# Patient Record
Sex: Female | Born: 1975 | Race: Black or African American | Hispanic: No | Marital: Single | Smoking: Never smoker
Health system: Southern US, Community
[De-identification: ages and names within clinical notes are randomized; demographics above are authoritative.]

## PROBLEM LIST (undated history)

## (undated) ENCOUNTER — Inpatient Hospital Stay (HOSPITAL_COMMUNITY): Payer: Self-pay

## (undated) DIAGNOSIS — F32A Depression, unspecified: Secondary | ICD-10-CM

## (undated) DIAGNOSIS — E669 Obesity, unspecified: Secondary | ICD-10-CM

## (undated) DIAGNOSIS — F419 Anxiety disorder, unspecified: Secondary | ICD-10-CM

## (undated) DIAGNOSIS — D649 Anemia, unspecified: Secondary | ICD-10-CM

## (undated) DIAGNOSIS — F329 Major depressive disorder, single episode, unspecified: Secondary | ICD-10-CM

## (undated) HISTORY — DX: Anemia, unspecified: D64.9

---

## 2006-05-26 ENCOUNTER — Emergency Department: Payer: Self-pay | Admitting: Emergency Medicine

## 2007-02-03 ENCOUNTER — Ambulatory Visit: Payer: Self-pay | Admitting: Internal Medicine

## 2007-02-17 ENCOUNTER — Encounter (INDEPENDENT_AMBULATORY_CARE_PROVIDER_SITE_OTHER): Payer: Self-pay | Admitting: Internal Medicine

## 2007-02-17 ENCOUNTER — Ambulatory Visit: Payer: Self-pay | Admitting: Internal Medicine

## 2007-02-23 ENCOUNTER — Encounter (INDEPENDENT_AMBULATORY_CARE_PROVIDER_SITE_OTHER): Payer: Self-pay | Admitting: Internal Medicine

## 2007-02-23 LAB — CONVERTED CEMR LAB

## 2007-09-08 ENCOUNTER — Encounter (INDEPENDENT_AMBULATORY_CARE_PROVIDER_SITE_OTHER): Payer: Self-pay | Admitting: Internal Medicine

## 2007-09-08 DIAGNOSIS — H4089 Other specified glaucoma: Secondary | ICD-10-CM | POA: Insufficient documentation

## 2008-01-27 ENCOUNTER — Telehealth (INDEPENDENT_AMBULATORY_CARE_PROVIDER_SITE_OTHER): Payer: Self-pay | Admitting: Internal Medicine

## 2008-02-03 ENCOUNTER — Encounter (INDEPENDENT_AMBULATORY_CARE_PROVIDER_SITE_OTHER): Payer: Self-pay | Admitting: Internal Medicine

## 2008-02-08 ENCOUNTER — Telehealth (INDEPENDENT_AMBULATORY_CARE_PROVIDER_SITE_OTHER): Payer: Self-pay | Admitting: Internal Medicine

## 2008-02-12 ENCOUNTER — Encounter (INDEPENDENT_AMBULATORY_CARE_PROVIDER_SITE_OTHER): Payer: Self-pay | Admitting: Internal Medicine

## 2008-02-13 ENCOUNTER — Encounter (INDEPENDENT_AMBULATORY_CARE_PROVIDER_SITE_OTHER): Payer: Self-pay | Admitting: Internal Medicine

## 2008-02-27 ENCOUNTER — Encounter (INDEPENDENT_AMBULATORY_CARE_PROVIDER_SITE_OTHER): Payer: Self-pay | Admitting: Internal Medicine

## 2008-06-05 ENCOUNTER — Encounter: Admission: RE | Admit: 2008-06-05 | Discharge: 2008-06-05 | Payer: Self-pay | Admitting: Obstetrics and Gynecology

## 2008-06-05 ENCOUNTER — Encounter (INDEPENDENT_AMBULATORY_CARE_PROVIDER_SITE_OTHER): Payer: Self-pay | Admitting: Internal Medicine

## 2008-08-16 ENCOUNTER — Inpatient Hospital Stay (HOSPITAL_COMMUNITY): Admission: RE | Admit: 2008-08-16 | Discharge: 2008-08-19 | Payer: Self-pay | Admitting: Obstetrics and Gynecology

## 2008-12-19 ENCOUNTER — Emergency Department (HOSPITAL_COMMUNITY): Admission: EM | Admit: 2008-12-19 | Discharge: 2008-12-19 | Payer: Self-pay | Admitting: Emergency Medicine

## 2010-03-04 ENCOUNTER — Emergency Department (HOSPITAL_COMMUNITY): Admission: EM | Admit: 2010-03-04 | Discharge: 2010-03-04 | Payer: Self-pay | Admitting: Emergency Medicine

## 2011-01-11 ENCOUNTER — Encounter: Payer: Self-pay | Admitting: Obstetrics and Gynecology

## 2011-02-21 ENCOUNTER — Emergency Department (HOSPITAL_COMMUNITY)
Admission: EM | Admit: 2011-02-21 | Discharge: 2011-02-21 | Disposition: A | Discharge status: Home / Self Care | Payer: Medicaid Other | Attending: Emergency Medicine | Admitting: Emergency Medicine

## 2011-02-21 DIAGNOSIS — R197 Diarrhea, unspecified: Secondary | ICD-10-CM | POA: Insufficient documentation

## 2011-02-21 DIAGNOSIS — R109 Unspecified abdominal pain: Secondary | ICD-10-CM | POA: Insufficient documentation

## 2011-02-21 DIAGNOSIS — R112 Nausea with vomiting, unspecified: Secondary | ICD-10-CM | POA: Insufficient documentation

## 2011-02-21 LAB — URINALYSIS, ROUTINE W REFLEX MICROSCOPIC
Bilirubin Urine: NEGATIVE
Glucose, UA: NEGATIVE mg/dL
Hgb urine dipstick: NEGATIVE
Ketones, ur: NEGATIVE mg/dL
Nitrite: NEGATIVE
Protein, ur: NEGATIVE mg/dL
Specific Gravity, Urine: 1.033 — ABNORMAL HIGH (ref 1.005–1.030)
Urobilinogen, UA: 0.2 mg/dL (ref 0.0–1.0)
pH: 5.5 (ref 5.0–8.0)

## 2011-02-21 LAB — POCT I-STAT, CHEM 8
BUN: 14 mg/dL (ref 6–23)
Calcium, Ion: 1.09 mmol/L — ABNORMAL LOW (ref 1.12–1.32)
Chloride: 109 mEq/L (ref 96–112)
Creatinine, Ser: 0.7 mg/dL (ref 0.4–1.2)
Glucose, Bld: 110 mg/dL — ABNORMAL HIGH (ref 70–99)
HCT: 40 % (ref 36.0–46.0)
Hemoglobin: 13.6 g/dL (ref 12.0–15.0)
Potassium: 3.8 mEq/L (ref 3.5–5.1)
Sodium: 139 mEq/L (ref 135–145)
TCO2: 20 mmol/L (ref 0–100)

## 2011-02-21 LAB — PREGNANCY, URINE: Preg Test, Ur: NEGATIVE

## 2011-03-16 LAB — CBC
HCT: 34.3 % — ABNORMAL LOW (ref 36.0–46.0)
Hemoglobin: 10.5 g/dL — ABNORMAL LOW (ref 12.0–15.0)
MCHC: 30.7 g/dL (ref 30.0–36.0)
MCV: 86.2 fL (ref 78.0–100.0)
Platelets: 264 10*3/uL (ref 150–400)
RBC: 3.97 MIL/uL (ref 3.87–5.11)
RDW: 15.7 % — ABNORMAL HIGH (ref 11.5–15.5)
WBC: 10.2 10*3/uL (ref 4.0–10.5)

## 2011-03-16 LAB — DIFFERENTIAL
Basophils Absolute: 0 10*3/uL (ref 0.0–0.1)
Basophils Relative: 0 % (ref 0–1)
Eosinophils Absolute: 0 10*3/uL (ref 0.0–0.7)
Eosinophils Relative: 0 % (ref 0–5)
Lymphocytes Relative: 7 % — ABNORMAL LOW (ref 12–46)
Lymphs Abs: 0.7 10*3/uL (ref 0.7–4.0)
Monocytes Absolute: 0.2 10*3/uL (ref 0.1–1.0)
Monocytes Relative: 2 % — ABNORMAL LOW (ref 3–12)
Neutro Abs: 9.3 10*3/uL — ABNORMAL HIGH (ref 1.7–7.7)
Neutrophils Relative %: 91 % — ABNORMAL HIGH (ref 43–77)

## 2011-03-16 LAB — POCT CARDIAC MARKERS
CKMB, poc: 1.1 ng/mL (ref 1.0–8.0)
CKMB, poc: <1 ng/mL — ABNORMAL LOW (ref 1.0–8.0)
Myoglobin, poc: 48.8 ng/mL (ref 12–200)
Myoglobin, poc: 67 ng/mL (ref 12–200)
Troponin i, poc: <0.05 ng/mL (ref 0.00–0.09)
Troponin i, poc: <0.05 ng/mL (ref 0.00–0.09)

## 2011-03-16 LAB — POCT I-STAT, CHEM 8
BUN: 8 mg/dL (ref 6–23)
Calcium, Ion: 1.14 mmol/L (ref 1.12–1.32)
Chloride: 105 mEq/L (ref 96–112)
Creatinine, Ser: 0.7 mg/dL (ref 0.4–1.2)
Glucose, Bld: 110 mg/dL — ABNORMAL HIGH (ref 70–99)
HCT: 35 % — ABNORMAL LOW (ref 36.0–46.0)
Hemoglobin: 11.9 g/dL — ABNORMAL LOW (ref 12.0–15.0)
Potassium: 3.8 mEq/L (ref 3.5–5.1)
Sodium: 138 mEq/L (ref 135–145)
TCO2: 23 mmol/L (ref 0–100)

## 2011-03-16 LAB — URINALYSIS, ROUTINE W REFLEX MICROSCOPIC
Bilirubin Urine: NEGATIVE
Glucose, UA: NEGATIVE mg/dL
Hgb urine dipstick: NEGATIVE
Ketones, ur: NEGATIVE mg/dL
Nitrite: NEGATIVE
Protein, ur: NEGATIVE mg/dL
Specific Gravity, Urine: 1.029 (ref 1.005–1.030)
Urobilinogen, UA: 1 mg/dL (ref 0.0–1.0)
pH: 8 (ref 5.0–8.0)

## 2011-03-16 LAB — POCT PREGNANCY, URINE: Preg Test, Ur: NEGATIVE

## 2011-03-16 LAB — D-DIMER, QUANTITATIVE (NOT AT ARMC): D-Dimer, Quant: 1.87 ug/mL-FEU — ABNORMAL HIGH (ref 0.00–0.48)

## 2011-05-05 NOTE — Discharge Summary (Signed)
[name], Brandy Foster               ACCOUNT NO.:  [id number]   MEDICAL RECORD NO.:  [id number]          PATIENT TYPE:  INP   LOCATION:  9148                          FACILITY:  [facility]   PHYSICIAN:  Crist Fat. Rivard, M.D. DATE OF BIRTH:  [birth year]   DATE OF ADMISSION:  08/16/2008  DATE OF DISCHARGE:  08/19/2008                               DISCHARGE SUMMARY   ADMITTING DIAGNOSES:  1. Intrauterine pregnancy at 38-6/7 weeks.  2. Previous cesarean section with desire for repeat.   DISCHARGE DIAGNOSES:  1. Status post a repeat low transverse cesarean section August 16, 2008 with findings of a viable female infant delivered at 9:47 a.m.      by the name of Brandy Foster.  Weight 6 pounds 12 ounces (3060 grams) 19      inches in length with Apgars of 8 and 9.  2. Bottle feeding primarily.  3. Anemia without hemodynamic instability.  4. History of depression without signs and symptoms postpartum.   PROCEDURES:  1. Repeat low transverse cesarean section.  2. Spinal anesthesia.   HOSPITAL COURSE:  Brandy Foster is a 35 year old gravida 5, para 1-0-3-1 who  presented on the date of admission at 38-6/7 weeks for an elective  repeat cesarean section.  She denied any leakage of fluid or bleeding,  positive fetal movement.  Pregnancy followed by Dr. Pennie Rushing and  remarkable for:  1. Three elective abortions.  2. Previous C section.  3. Fibroids.  4. Elevated BMI.  5. History of anemia.  6. History of depression approximately 10 years ago.   On the day of admission the patient verbalized desire to proceed with  repeat low transverse cesarean section for delivery.  The patient was  prepped in short stay and was taken to the OR, where a repeat low  transverse cesarean section was performed by Dr. Dierdre Forth with  Wynelle Bourgeois, certified nurse midwife, assisting and it was under  spinal anesthesia.  Findings were a viable female infant by the name of  Brandy Foster with a weight of 6 pounds 12  ounces, Apgars 8 and 9.  Estimated  blood loss was 750 mL.  The patient tolerated well, was taken to the  PACU in good condition, and newborn female to full-term nursery.  Postoperative day #1 she was doing well, tolerating p.o.'s.  She was up  on the sofa.  Weight was 126.1 kg.  Had good urinary output.  JP drain 8  mL.  CBC:  White count was 9.6, hemoglobin 8.8, hematocrit 26.9 and  platelets stable at 194.   PHYSICAL EXAM:  Was within normal limits.  Dressing was clean, dry and  intact.  ABDOMEN:  Appropriately tender but soft.  Fundus was firm.  Lochia  within normal limits as well as extremities.   PLAN:  To continue routine postpartum care.  She had a social work  consult secondary to the history of depression on August 18, 2008.  The  father of baby is reported to live in [place].  She is unemployed, though  she does have Medicaid and appropriate  resources including food stamps  and WIC.  She has supportive family and friends.  Family was present and  social work deferred to come back to talk with the patient later on.  By  postoperative #2, August 18, 2008, she was feeling okay.  Pain well  controlled with meds.  She was ambulating and voiding and tolerating  p.o. liquids and solids without difficulty.  Denied any dizziness or  syncope.  She had positive BM, positive flatus.  Bottle-feeding.  Remained afebrile.  Vital signs stable.  She had bowel sounds in all 4  quadrants.  Fundus was firm.  Scant lochia.  The JP drain had 20 mL over  the previous 24 hours.  Negative Homan's sign.  Negative edema.  Had  discussed that day possibly going home after social work consult.  JP  drain was removed without difficulty by Dr. Estanislado Pandy.  At 11:30 a.m. on  August 18, 2008 work returned to reassess the patient's needs.  She  reported good rapport.  She did speak to her and sisters present after  the patient's agreement.  The patient appeared stable.  Social work did  return again on August 19, 2008 at 10:15 a.m. because RNs  had noted the  patient had not been seen holding and bonding with baby.  The patient's  sister had been primarily holding the baby.  The patient had been  holding the baby during assessment this morning.  She does have sister  and mother to assist her at home.  She is a single mom.  Has a 10-year-  old son.  Social work agreed that the patient was appropriate and okay  for discharge.  The patient had reported another bowel movement this  morning.  She was considering breast-feeding when her milk was in.  No  signs or symptoms of postpartum depression.  She has been up ad lib  without dizziness, tolerating a regular diet, spontaneous voids with  some pressure.  Pain managed with Motrin and p.r.n. Percocet.  Undecided  on birth control but considering pills.  She remains afebrile and vital  signs stable.  Weight 124.5 kg.  Physical exam is within normal limits.  The patient was deemed to have received full benefit of her hospital  stay and was discharged home in stable condition.   DISCHARGE MEDICATIONS:  1. Motrin 600 mg p.o. q.6 h p.r.n. pain.  2. Percocet 5/325 one to two tablets p.o. q. 4-6 hours p.r.n. moderate      to severe pain.  3. Concept OB prenatal vitamin 1 tablet p.o. q. Day.  4. Ferralet 90 one tablet p.o. daily p.r.n. anemia.  5. Stool softener of choice per patient over-the-counter 1 tablet p.o.      daily.  May repeat dose in the evening.   DISCHARGE FOLLOWUP:  She has a 6-week postpartum appointment on September 26, 2008 as already scheduled or may follow up p.r.n.   POSTPARTUM INSTRUCTIONS:  Were per [facility] pamphlet.  Warning signs and  symptoms to report were reviewed.      Candice [place], [place]      ______________________________  Crist Fat Rivard, M.D.    CHS/MEDQ  D:  08/19/2008  T:  08/19/2008  Job:  [id number]

## 2011-05-05 NOTE — H&P (Signed)
Brandy, Foster               ACCOUNT NO.:  [id number]   MEDICAL RECORD NO.:  [id number]          PATIENT TYPE:  INP   LOCATION:  9199                          [facility]ACILITY:  [facility]   PHYSICIAN:  Hal Morales, M.D.DATE OF BIRTH:  [birth year]   DATE OF ADMISSION:  08/16/2008  DATE OF DISCHARGE:                              HISTORY & PHYSICAL   HISTORY OF PRESENT ILLNESS:  This is a 35 year old gravida 5, para 1-0-3-  1 at 38-6/7 weeks' who presents for elective repeat C-section.  She  denies any leaking or bleeding, and reports positive fetal movement.  Pregnancy was followed by Dr. Pennie Rushing, remarkable for:  1. Three elective abortions.  2. Previous C-section.  3. Fibroid.  4. Elevated BMI.   ALLERGIES:  None.   OB HISTORY:  Remarkable for AB in 2001, 2002, and 2005.  She had a C-  section delivery in 1998 of a female infant at [redacted] weeks gestation,  weighing 8 pounds, related to failure to progress.   PAST MEDICAL HISTORY:  Remarkable for history of anemia, history of mild  postpartum depression, history of fibroids, childhood varicella, and  elevated BMI.  She also has a history of unknown type of psychiatric  issues, but there is no current medical follow up for a month.   PAST SURGICAL HISTORY:  Remarkable for C-section in 1998 and EAB in  2001, 2002, and 2005.   FAMILY HISTORY:  Remarkable for grandmother with MI.  Mother and father  with hypertension.  Mother with pulmonary disease of unknown type as  well as diabetes and TIAs.  Brother with schizophrenia.   GENETIC HISTORY:  Negative.   SOCIAL HISTORY:  The patient is single.  Father of the baby Arbutus Ped is  not currently with her.  Her sister Tammy with her today.  She does not  report a religious affiliation.  She denies any alcohol, tobacco, or  drug use.   PRENATAL LABS:  Hemoglobin 10.6 and platelets 258,000.  Blood type A  positive, antibody screen negative, RPR nonreactive, rubella immune,  hepatitis negative,  and HIV negative.  Pap test normal.  Gonorrhea  negative and chlamydia negative.  Cystic fibrosis declined.   HISTORY OF CURRENT PREGNANCY:  The patient entered care at [redacted] weeks  gestation.  She had an ultrasound done, which confirmed dates.  She had  a quad screen done that was normal.  Glucola at 27 weeks was normal.  She had some cysts on the back of her neck and was seen for that with no  plan identified in her records currently, but these areas decreased over  time after treatment with Keflex.  She presents today for elective  repeat C-section   OBJECTIVE:  VITAL SIGNS:  Stable and afebrile.  HEENT:  Within normal limits.  NECK:  Thyroid normal not enlarged.  CHEST:  Clear to auscultation.  HEART:  Regular rate and rhythm.  ABDOMEN:  Gravid at 39 cm, vertex Leopold's.  Fetal heart rate has not  been evaluated yet.  PELVIC:  Declined or deferred.  EXTREMITIES:  Within normal limits without edema.  ASSESSMENT:  1. Intrauterine pregnancy at 38-6/7 weeks.  2. Previous cesarean section, desires repeat.   PLAN:  1. Admit to operating suites per Dr. Pennie Rushing.  2. Routine preop orders.      Marie L. Mayford Knife, C.N.M.      ______________________________  Hal Morales, M.D.    MLW/MEDQ  D:  08/16/2008  T:  08/16/2008  Job:  [id number]

## 2011-05-05 NOTE — Op Note (Signed)
Brandy Foster, Brandy Foster               ACCOUNT NO.:  [id number]   MEDICAL RECORD NO.:  [id number]          PATIENT TYPE:  INP   LOCATION:  9148                          [facility]   PHYSICIAN:  Hal Morales, M.D.DATE OF BIRTH:  [birth year]   DATE OF PROCEDURE:  08/16/2008  DATE OF DISCHARGE:                               OPERATIVE REPORT   PREOPERATIVE DIAGNOSES:  1. Intrauterine pregnancy at term.  2. Prior cesarean section with desire for repeat.   POSTOPERATIVE DIAGNOSES:  1. Intrauterine pregnancy at term.  2. Prior cesarean section with desire for repeat.   OPERATION:  Repeat low-transverse cesarean section.   SURGEON:  Hal Morales, MD   FIRST ASSISTANT:  Elby Showers. Mayford Knife, Certified Nurse Midwife   ANESTHESIA:  Spinal.   ESTIMATED BLOOD LOSS:  750 mL.   COMPLICATIONS:  None.   FINDINGS:  The patient was delivered of a female infant whose name is  Melone, weighing 6 pounds 12 ounces with a Apgars of 8 and 9 at 1 and 5  minutes respectively.  The uterus, tubes and ovaries were normal for  gravid state.  The placenta contained an eccentrically inserted three-  vessel cord.   PROCEDURE:  The patient was taken to the operating room after  appropriate identification and placed on the operating table.  After  placement of a spinal anesthetic, she was placed in the supine position  with a left lateral tilt.  The abdomen and perineum were prepped with  multiple layers of Betadine and a Foley catheter inserted into the  bladder then connected to straight drainage.  The abdomen was draped as  a sterile field.  After assurance of adequate surgical anesthesia, the  suprapubic region was infiltrated with 20 mL of 0.25% Marcaine.  A  suprapubic incision was made and the abdomen opened in layers.  The  peritoneum was entered and the bladder blade placed.  The uterus was  incised approximately 2-cm above the uterovesical fold and that incision  taken laterally on either  side.  The membranes were ruptured with the  egress of clear fluid.  The infant was delivered with the aid of a Kiwi  vacuum extractor from the occiput transverse position and after having  the nares and pharynx suctioned and the cord clamped and cut, and was  handed off to the awaiting pediatricians.  The placenta was allowed to  spontaneously separate from the uterus and was removed from the  operative field and given to the employees of [facility]  for cord blood collection.  The endometrial cavity was then cleared  products of conception and the uterine incision closed with running  interlocking suture of 0 Vicryl.  An imbricating suture of 0 Vicryl was  then placed of adequate hemostasis.  Copious irrigation was carried out.  The abdominal peritoneum was closed with a running suture of 2-0 Vicryl.  The rectus muscles were approximated in the midline with figure-of-eight  suture of 2-0 Vicryl.  The rectus fascia was closed with a running  suture of 0 Vicryl and then reinforced on either side  of midline with  figure-of-eight sutures of 0 Vicryl.  The subcutaneous tissue was  irrigated and made hemostatic with Bovie cautery.  A subcutaneous  Jackson-Pratt drain was placed through a stab incision in the left lower  quadrant and sewn in with a suture of 0 silk.  The skin incision was  closed with a subcuticular suture of 3-0 Monocryl.  Sterile Steri-Strips  were applied and a sterile dressing applied.  The patient was taken from  the operating room to the recovery room in satisfactory condition and  having tolerated the procedure well with sponge and instrument counts  correct.  The infant went to the full-term nursery.      Hal Morales, M.D.  Electronically Signed     VPH/MEDQ  D:  08/16/2008  T:  08/17/2008  Job:  [id number]

## 2011-06-15 ENCOUNTER — Ambulatory Visit (INDEPENDENT_AMBULATORY_CARE_PROVIDER_SITE_OTHER): Payer: Medicaid Other | Admitting: Family Medicine

## 2011-06-15 ENCOUNTER — Encounter: Payer: Self-pay | Admitting: Family Medicine

## 2011-06-15 VITALS — BP 138/85 | HR 88 | Temp 99.1°F | Ht 67.13 in | Wt 278.0 lb

## 2011-06-15 DIAGNOSIS — F489 Nonpsychotic mental disorder, unspecified: Secondary | ICD-10-CM

## 2011-06-15 DIAGNOSIS — Z309 Encounter for contraceptive management, unspecified: Secondary | ICD-10-CM

## 2011-06-15 DIAGNOSIS — H4089 Other specified glaucoma: Secondary | ICD-10-CM

## 2011-06-15 DIAGNOSIS — F99 Mental disorder, not otherwise specified: Secondary | ICD-10-CM

## 2011-06-15 DIAGNOSIS — IMO0001 Reserved for inherently not codable concepts without codable children: Secondary | ICD-10-CM

## 2011-06-15 DIAGNOSIS — D509 Iron deficiency anemia, unspecified: Secondary | ICD-10-CM | POA: Insufficient documentation

## 2011-06-15 DIAGNOSIS — D649 Anemia, unspecified: Secondary | ICD-10-CM

## 2011-06-15 DIAGNOSIS — Z0000 Encounter for general adult medical examination without abnormal findings: Secondary | ICD-10-CM

## 2011-06-15 LAB — CBC
HCT: 34.3 % — ABNORMAL LOW (ref 36.0–46.0)
Hemoglobin: 10.3 g/dL — ABNORMAL LOW (ref 12.0–15.0)
MCH: 26.7 pg (ref 26.0–34.0)
MCHC: 30 g/dL (ref 30.0–36.0)
MCV: 88.9 fL (ref 78.0–100.0)
Platelets: 313 10*3/uL (ref 150–400)
RBC: 3.86 MIL/uL — ABNORMAL LOW (ref 3.87–5.11)
RDW: 15.3 % (ref 11.5–15.5)
WBC: 7.7 10*3/uL (ref 4.0–10.5)

## 2011-06-15 MED ORDER — ETONOGESTREL-ETHINYL ESTRADIOL 0.12-0.015 MG/24HR VA RING: 1.0000 | VAGINAL_RING | VAGINAL | Status: DC

## 2011-06-15 NOTE — Patient Instructions (Addendum)
Go to psychiatrist and get medical records Ask central Robbie Lis to send records to our clinic.  Make an appointment for pap smear 1-2 months. Take nuvaring as directed.  Get an appt with your eye doctor for your yearly exam to recheck glaucoma.

## 2011-06-16 ENCOUNTER — Encounter: Payer: Self-pay | Admitting: Family Medicine

## 2011-06-19 DIAGNOSIS — IMO0001 Reserved for inherently not codable concepts without codable children: Secondary | ICD-10-CM | POA: Insufficient documentation

## 2011-06-19 DIAGNOSIS — Z0000 Encounter for general adult medical examination without abnormal findings: Secondary | ICD-10-CM | POA: Insufficient documentation

## 2011-06-19 DIAGNOSIS — F99 Mental disorder, not otherwise specified: Secondary | ICD-10-CM

## 2011-06-19 HISTORY — DX: Mental disorder, not otherwise specified: F99

## 2011-06-19 NOTE — Assessment & Plan Note (Signed)
Pt to return for pap smear.  Pt to request records from [facility].

## 2011-06-19 NOTE — Assessment & Plan Note (Signed)
Pt states that she is on disability 2/2 mental illness but she doesn't know what her diagnosis is.  Pt is to request records from her psychiatrist.

## 2011-06-19 NOTE — Assessment & Plan Note (Signed)
Will recheck cbc to see if anemia persists and if iron supplementation is still needed.

## 2011-06-19 NOTE — Assessment & Plan Note (Signed)
Pt given rx for nuvaring.  Pt interested in implanon.  Will let me know if she decides she would like that instead.

## 2011-06-19 NOTE — Progress Notes (Signed)
  Subjective:    Patient ID: Brandy Foster, female    DOB: [birth year], 35 y.o.   MRN: [id number]  HPI Pt here to establish care.  States she has not had a pcp in years.  Only seen ob for pregnancy care.    Reviewed pmh, sh, fh, medicines and placed under appropriate tabs in chart.  Anemia- Taking iron off and on.  Was started after delivery of 35 year old.  Has not had cbc recheck recently.  Wants to know if she should still be taking iron supplement.  Denies fatigue, dizziness, decreased energy.  Birth control- Currently sexually active with 1 partner.  Usually uses condoms but not 100% of the time.  States she has used nuvaring in the past without problems.  Would like to learn more about other forms of contraception.   Review of Systems    as per above.  No fever. No weight loss. No sob. No cp. No cough. Objective:   Physical Exam  Constitutional: She is oriented to person, place, and time. She appears well-developed and well-nourished.  HENT:  Head: Normocephalic and atraumatic.  Cardiovascular: Normal rate and normal heart sounds.   No murmur heard. Pulmonary/Chest: Effort normal and breath sounds normal. No respiratory distress.  Abdominal: Soft. She exhibits no distension.  Musculoskeletal: Normal range of motion. She exhibits no edema and no tenderness.  Neurological: She is alert and oriented to person, place, and time.  Skin: No rash noted.  Psychiatric: She has a normal mood and affect. Her behavior is normal. Judgment and thought content normal.          Assessment & Plan:

## 2011-06-19 NOTE — Assessment & Plan Note (Signed)
Pt is due for yearly exam.  Not currently on eye medication.  She was told at last optho appt she no longer needs them.  Encouraged pt to schedule yearly eye exam.

## 2011-11-20 ENCOUNTER — Encounter: Payer: Medicaid Other | Admitting: Family Medicine

## 2011-12-07 ENCOUNTER — Encounter: Payer: Medicaid Other | Admitting: Family Medicine

## 2012-04-11 ENCOUNTER — Encounter: Payer: Medicaid Other | Admitting: Family Medicine

## 2012-05-13 ENCOUNTER — Telehealth (HOSPITAL_COMMUNITY): Payer: Self-pay | Admitting: Psychiatry

## 2012-05-13 ENCOUNTER — Encounter (HOSPITAL_COMMUNITY): Payer: Self-pay | Admitting: Psychiatry

## 2012-05-13 NOTE — Telephone Encounter (Signed)
CALLED PATIENT REGARDING FMLA.

## 2012-05-13 NOTE — Telephone Encounter (Signed)
Erroneous ENCOUNTER, ALL TELEPHONE NOTES FROM THIS PROVIDER IS IN ERROR.

## 2012-11-17 DIAGNOSIS — R110 Nausea: Secondary | ICD-10-CM | POA: Insufficient documentation

## 2012-11-17 DIAGNOSIS — Z862 Personal history of diseases of the blood and blood-forming organs and certain disorders involving the immune mechanism: Secondary | ICD-10-CM | POA: Insufficient documentation

## 2012-11-17 DIAGNOSIS — H53149 Visual discomfort, unspecified: Secondary | ICD-10-CM | POA: Insufficient documentation

## 2012-11-17 DIAGNOSIS — G43909 Migraine, unspecified, not intractable, without status migrainosus: Secondary | ICD-10-CM | POA: Insufficient documentation

## 2012-11-17 DIAGNOSIS — Z3202 Encounter for pregnancy test, result negative: Secondary | ICD-10-CM | POA: Insufficient documentation

## 2012-11-17 DIAGNOSIS — Z202 Contact with and (suspected) exposure to infections with a predominantly sexual mode of transmission: Secondary | ICD-10-CM | POA: Insufficient documentation

## 2012-11-18 ENCOUNTER — Emergency Department (HOSPITAL_COMMUNITY)
Admission: EM | Admit: 2012-11-18 | Discharge: 2012-11-18 | Disposition: A | Discharge status: Home / Self Care | Payer: Medicaid Other | Attending: Emergency Medicine | Admitting: Emergency Medicine

## 2012-11-18 ENCOUNTER — Encounter (HOSPITAL_COMMUNITY): Payer: Self-pay | Admitting: *Deleted

## 2012-11-18 DIAGNOSIS — Z202 Contact with and (suspected) exposure to infections with a predominantly sexual mode of transmission: Secondary | ICD-10-CM

## 2012-11-18 LAB — URINALYSIS, ROUTINE W REFLEX MICROSCOPIC
Bilirubin Urine: NEGATIVE
Glucose, UA: NEGATIVE mg/dL
Ketones, ur: NEGATIVE mg/dL
Leukocytes, UA: NEGATIVE
Nitrite: NEGATIVE
Protein, ur: NEGATIVE mg/dL
Specific Gravity, Urine: 1.02 (ref 1.005–1.030)
Urobilinogen, UA: 1 mg/dL (ref 0.0–1.0)
pH: 7 (ref 5.0–8.0)

## 2012-11-18 LAB — RAPID HIV SCREEN (WH-MAU): Rapid HIV Screen: NONREACTIVE

## 2012-11-18 LAB — WET PREP, GENITAL
Trich, Wet Prep: NONE SEEN
Yeast Wet Prep HPF POC: NONE SEEN

## 2012-11-18 LAB — URINE MICROSCOPIC-ADD ON

## 2012-11-18 LAB — PREGNANCY, URINE: Preg Test, Ur: NEGATIVE

## 2012-11-18 MED ORDER — AZITHROMYCIN 250 MG PO TABS
1000.0000 mg | ORAL_TABLET | Freq: Once | ORAL | Status: AC
  Administered 2012-11-18: 1000 mg via ORAL
  Filled 2012-11-18: qty 4

## 2012-11-18 MED ORDER — CEFTRIAXONE SODIUM 250 MG IJ SOLR
250.0000 mg | Freq: Once | INTRAMUSCULAR | Status: DC
  Filled 2012-11-18: qty 250

## 2012-11-18 MED ORDER — DOXYCYCLINE HYCLATE 100 MG PO CAPS: 100.0000 mg | ORAL_CAPSULE | Freq: Two times a day (BID) | ORAL | Status: DC

## 2012-11-18 MED ORDER — LIDOCAINE HCL (PF) 1 % IJ SOLN
INTRAMUSCULAR | Status: AC
  Administered 2012-11-18: 1 mL
  Filled 2012-11-18: qty 5
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
## 2012-11-18 NOTE — ED Provider Notes (Signed)
Medical screening examination/treatment/procedure(s) were performed by non-physician practitioner and as supervising physician I was immediately available for consultation/collaboration.   Zarek Relph M Siyah Mault, MD 11/18/12 0913 

## 2012-11-18 NOTE — ED Notes (Signed)
Pt refused rocephin injection.
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
## 2012-11-20 LAB — GC/CHLAMYDIA PROBE AMP
CT Probe RNA: NEGATIVE
GC Probe RNA: NEGATIVE

## 2013-06-11 ENCOUNTER — Encounter (HOSPITAL_COMMUNITY): Payer: Self-pay | Admitting: Adult Health

## 2013-06-11 ENCOUNTER — Emergency Department (HOSPITAL_COMMUNITY)
Admission: EM | Admit: 2013-06-11 | Discharge: 2013-06-11 | Disposition: A | Discharge status: Home / Self Care | Payer: Medicaid Other | Attending: Emergency Medicine | Admitting: Emergency Medicine

## 2013-06-11 DIAGNOSIS — H6690 Otitis media, unspecified, unspecified ear: Secondary | ICD-10-CM | POA: Insufficient documentation

## 2013-06-11 DIAGNOSIS — J029 Acute pharyngitis, unspecified: Secondary | ICD-10-CM | POA: Insufficient documentation

## 2013-06-11 DIAGNOSIS — H9209 Otalgia, unspecified ear: Secondary | ICD-10-CM | POA: Insufficient documentation

## 2013-06-11 DIAGNOSIS — Z862 Personal history of diseases of the blood and blood-forming organs and certain disorders involving the immune mechanism: Secondary | ICD-10-CM | POA: Insufficient documentation

## 2013-06-11 DIAGNOSIS — H6691 Otitis media, unspecified, right ear: Secondary | ICD-10-CM

## 2013-06-11 DIAGNOSIS — J3489 Other specified disorders of nose and nasal sinuses: Secondary | ICD-10-CM | POA: Insufficient documentation

## 2013-06-11 MED ORDER — AMOXICILLIN 500 MG PO CAPS: 500.0000 mg | ORAL_CAPSULE | Freq: Three times a day (TID) | ORAL | Status: DC

## 2013-06-11 MED ORDER — ANTIPYRINE-BENZOCAINE 5.4-1.4 % OT SOLN: 3.0000 [drp] | OTIC | Status: DC | PRN

## 2013-06-11 NOTE — ED Provider Notes (Signed)
History  This chart was scribed for Brandy Jakes, MD by Ardelia Mems, ED Scribe. This patient was seen in room TR08C/TR08C and the patient's care was started at 8:49 PM.   CSN: [id number]  Arrival date & time 06/11/13  1932       Chief Complaint  Patient presents with  . Facial Pain     The history is provided by the patient. No language interpreter was used.    HPI Comments: Brandy Foster is a 37 y.o. female with a hx of anemia who presents to the Emergency Department complaining of a gradually worsening, constant sore throat onset 2 days ago. Pt states that the sore throat has persisted and she had an onset of other, associated symptoms yesterday, including nasal congestion, sinus pressure and bilateral ear pain (right greater than left). Pt states ear pain increasing in severity, feeling "clogged." Pt has taken no interventions. Nothing makes it better or worse. Pt denies sick contacts. Pt denies fever, vomiting, diarrhea or any other symptoms.   PCP- Dr. Cindra Presume    Past Medical History  Diagnosis Date  . Anemia     Past Surgical History  Procedure Laterality Date  . Cesarean section      Family History  Problem Relation Age of Onset  . Diabetes Mother   . COPD Mother   . Arthritis Mother   . Hypertension Mother   . Hyperlipidemia Father   . Schizophrenia Brother     History  Substance Use Topics  . Smoking status: Never Smoker   . Smokeless tobacco: Never Used  . Alcohol Use: No    OB History   Grav Para Term Preterm Abortions TAB SAB Ect Mult Living                  Review of Systems  Constitutional: Negative for fever.  HENT: Positive for ear pain, congestion, sore throat and sinus pressure.   Respiratory: Negative for cough and shortness of breath.   Cardiovascular: Negative for chest pain.  Gastrointestinal: Negative for nausea, vomiting, abdominal pain and diarrhea.  Genitourinary: Negative for dysuria and hematuria.  Skin:  Negative for rash.   A complete 10 system review of systems was obtained and all systems are negative except as noted in the HPI and PMH.   Allergies  Review of patient's allergies indicates no known allergies.  Home Medications  No current outpatient prescriptions on file.  Triage Vitals: BP 109/65  Pulse 91  Temp(Src) 99.2 F (37.3 C) (Oral)  Resp 16  SpO2 99%  Physical Exam  Nursing note and vitals reviewed. Constitutional: She is oriented to person, place, and time. She appears well-developed and well-nourished. No distress.  HENT:  Head: Normocephalic and atraumatic. No trismus in the jaw.  Right Ear: Hearing and tympanic membrane normal.  Left Ear: Hearing normal. There is tenderness. No drainage. Tympanic membrane is bulging.  Nose: Nose normal. Right sinus exhibits no maxillary sinus tenderness and no frontal sinus tenderness. Left sinus exhibits no maxillary sinus tenderness and no frontal sinus tenderness.  Mouth/Throat: Posterior oropharyngeal erythema present. No oropharyngeal exudate, posterior oropharyngeal edema or tonsillar abscesses.  Left TM erythematous, bulging, tender, dull light reflex  Eyes: Conjunctivae and EOM are normal.  Neck: Normal range of motion. Neck supple.  No meningeal signs  Cardiovascular: Normal rate, regular rhythm and normal heart sounds.  Exam reveals no gallop and no friction rub.   No murmur heard. Pulmonary/Chest: Effort normal and breath sounds normal. No  respiratory distress. She has no wheezes. She has no rales. She exhibits no tenderness.  Abdominal: Soft. Bowel sounds are normal. She exhibits no distension. There is no tenderness. There is no rebound and no guarding.  Musculoskeletal: Normal range of motion. She exhibits no edema and no tenderness.  Neurological: She is alert and oriented to person, place, and time. No cranial nerve deficit.  Skin: Skin is warm and dry. She is not diaphoretic. No erythema.    ED Course   Procedures (including critical care time)  DIAGNOSTIC STUDIES: Oxygen Saturation is 99% on RA, normal by my interpretation.    COORDINATION OF CARE: 9:05 PM- Pt advised of plan for treatment with antibiotics and ear drops upon discharge and pt agrees.      Labs Reviewed - No data to display No results found. Discharge Medication List as of 06/11/2013  9:20 PM    START taking these medications   Details  amoxicillin (AMOXIL) 500 MG capsule Take 1 capsule (500 mg total) by mouth 3 (three) times daily., Starting 06/11/2013, Until Discontinued, Print    antipyrine-benzocaine (AURALGAN) otic solution Place 3 drops into the left ear every 2 (two) hours as needed for pain., Starting 06/11/2013, Until Discontinued, Print         1. Right otitis media       MDM  Patient presents with otalgia and exam consistent with acute otitis media. No concern for acute mastoiditis, meningitis. No antibiotic use in the last month.  Patient discharged home with Amoxicillin.   I have also discussed reasons to return immediately to the ER.  Pt expresses understanding and agrees with plan. I personally performed the services described in this documentation, which was scribed in my presence. The recorded information has been reviewed and is accurate.    Glade Nurse, PA-C 06/11/13 2354

## 2013-06-11 NOTE — ED Notes (Signed)
Presents with 2 days of stuffy nose, congestion, sinus pressure and bilateral earaches.

## 2013-06-13 NOTE — ED Provider Notes (Signed)
Medical screening examination/treatment/procedure(s) were performed by non-physician practitioner and as supervising physician I was immediately available for consultation/collaboration.   [name], MD 06/13/13 [phone]

## 2013-11-04 ENCOUNTER — Inpatient Hospital Stay (HOSPITAL_COMMUNITY)
Admission: AD | Admit: 2013-11-04 | Discharge: 2013-11-04 | Disposition: A | Discharge status: Home / Self Care | Payer: Medicaid Other | Source: Ambulatory Visit | Attending: Obstetrics and Gynecology | Admitting: Obstetrics and Gynecology

## 2013-11-04 ENCOUNTER — Inpatient Hospital Stay (HOSPITAL_COMMUNITY): Payer: Medicaid Other

## 2013-11-04 ENCOUNTER — Encounter (HOSPITAL_COMMUNITY): Payer: Self-pay

## 2013-11-04 DIAGNOSIS — R109 Unspecified abdominal pain: Secondary | ICD-10-CM | POA: Insufficient documentation

## 2013-11-04 DIAGNOSIS — O2390 Unspecified genitourinary tract infection in pregnancy, unspecified trimester: Secondary | ICD-10-CM | POA: Insufficient documentation

## 2013-11-04 DIAGNOSIS — O9989 Other specified diseases and conditions complicating pregnancy, childbirth and the puerperium: Secondary | ICD-10-CM

## 2013-11-04 DIAGNOSIS — N760 Acute vaginitis: Secondary | ICD-10-CM | POA: Insufficient documentation

## 2013-11-04 DIAGNOSIS — O26899 Other specified pregnancy related conditions, unspecified trimester: Secondary | ICD-10-CM

## 2013-11-04 DIAGNOSIS — A499 Bacterial infection, unspecified: Secondary | ICD-10-CM | POA: Insufficient documentation

## 2013-11-04 DIAGNOSIS — B9689 Other specified bacterial agents as the cause of diseases classified elsewhere: Secondary | ICD-10-CM

## 2013-11-04 HISTORY — DX: Depression, unspecified: F32.A

## 2013-11-04 HISTORY — DX: Anxiety disorder, unspecified: F41.9

## 2013-11-04 HISTORY — DX: Major depressive disorder, single episode, unspecified: F32.9

## 2013-11-04 LAB — URINE MICROSCOPIC-ADD ON

## 2013-11-04 LAB — POCT PREGNANCY, URINE: Preg Test, Ur: POSITIVE — AB

## 2013-11-04 LAB — URINALYSIS, ROUTINE W REFLEX MICROSCOPIC
Bilirubin Urine: NEGATIVE
Glucose, UA: NEGATIVE mg/dL
Ketones, ur: NEGATIVE mg/dL
Leukocytes, UA: NEGATIVE
Nitrite: NEGATIVE
Protein, ur: NEGATIVE mg/dL
Specific Gravity, Urine: >1.03 — ABNORMAL HIGH (ref 1.005–1.030)
Urobilinogen, UA: 0.2 mg/dL (ref 0.0–1.0)
pH: 6 (ref 5.0–8.0)

## 2013-11-04 LAB — CBC
HCT: 32.8 % — ABNORMAL LOW (ref 36.0–46.0)
Hemoglobin: 10.1 g/dL — ABNORMAL LOW (ref 12.0–15.0)
MCH: 26.7 pg (ref 26.0–34.0)
MCHC: 30.8 g/dL (ref 30.0–36.0)
MCV: 86.8 fL (ref 78.0–100.0)
Platelets: 297 10*3/uL (ref 150–400)
RBC: 3.78 MIL/uL — ABNORMAL LOW (ref 3.87–5.11)
RDW: 16 % — ABNORMAL HIGH (ref 11.5–15.5)
WBC: 10 10*3/uL (ref 4.0–10.5)

## 2013-11-04 LAB — WET PREP, GENITAL
Clue Cells Wet Prep HPF POC: NONE SEEN
Trich, Wet Prep: NONE SEEN
Yeast Wet Prep HPF POC: NONE SEEN

## 2013-11-04 LAB — HCG, QUANTITATIVE, PREGNANCY: hCG, Beta Chain, Quant, S: 1722 m[IU]/mL — ABNORMAL HIGH (ref <5)

## 2013-11-04 MED ORDER — METRONIDAZOLE 500 MG PO TABS: 500.0000 mg | ORAL_TABLET | Freq: Two times a day (BID) | ORAL | Status: DC

## 2013-11-04 NOTE — MAU Provider Note (Signed)
History     CSN: [id number]  Arrival date and time: 11/04/13 1652   None     Chief Complaint  Patient presents with  . Vaginal Discharge   HPI 37 y.o. [id number] at [redacted]w[redacted]d with mid-low abd cramping x several days, no relief with tylenol. One episode of brownish vaginal discharge a few days ago, none since, no bleeding. Per review of prior records, blood type is A pos.   Past Medical History  Diagnosis Date  . Anemia   . Depression   . Anxiety     Past Surgical History  Procedure Laterality Date  . Cesarean section      Family History  Problem Relation Age of Onset  . Diabetes Mother   . COPD Mother   . Arthritis Mother   . Hypertension Mother   . Hyperlipidemia Father   . Schizophrenia Brother     History  Substance Use Topics  . Smoking status: Never Smoker   . Smokeless tobacco: Never Used  . Alcohol Use: No    Allergies: No Known Allergies  Prescriptions prior to admission  Medication Sig Dispense Refill  . ferrous sulfate 325 (65 FE) MG tablet Take 325 mg by mouth daily.      Marland Kitchen ofloxacin (OCUFLOX) 0.3 % ophthalmic solution Place 1 drop into both eyes 3 (three) times daily. Pt started ~ 10/21/13        Review of Systems  Constitutional: Negative.   Respiratory: Negative.   Cardiovascular: Negative.   Gastrointestinal: Positive for abdominal pain (cramping). Negative for nausea, vomiting, diarrhea and constipation.  Genitourinary: Negative for dysuria, urgency, frequency, hematuria and flank pain.       Negative for vaginal bleeding, vaginal discharge, dyspareunia  Musculoskeletal: Negative.   Neurological: Negative.   Psychiatric/Behavioral: Negative.    Physical Exam   Blood pressure 108/63, pulse 95, temperature 98.6 F (37 C), temperature source Oral, resp. rate 18, height 5\' 7"  (1.702 m), weight 290 lb 4 oz (131.657 kg), last menstrual period 09/13/2013.  Physical Exam  Nursing note and vitals reviewed. Constitutional: She is oriented to  person, place, and time. She appears well-developed and well-nourished. No distress.  Cardiovascular: Normal rate.   Respiratory: Effort normal.  GI: Soft. There is no tenderness.  Genitourinary: Vaginal discharge (malodorous, brownish) found.  Swab only, patient declines full pelvic exam   Musculoskeletal: Normal range of motion.  Neurological: She is alert and oriented to person, place, and time.  Skin: Skin is warm and dry.  Psychiatric: She has a normal mood and affect.    MAU Course  Procedures  Results for orders placed during the hospital encounter of 11/04/13 (from the past 24 hour(s))  URINALYSIS, ROUTINE W REFLEX MICROSCOPIC     Status: Abnormal   Collection Time    11/04/13  5:05 PM      Result Value Range   Color, Urine YELLOW  YELLOW   APPearance CLEAR  CLEAR   Specific Gravity, Urine >1.030 (*) 1.005 - 1.030   pH 6.0  5.0 - 8.0   Glucose, UA NEGATIVE  NEGATIVE mg/dL   Hgb urine dipstick TRACE (*) NEGATIVE   Bilirubin Urine NEGATIVE  NEGATIVE   Ketones, ur NEGATIVE  NEGATIVE mg/dL   Protein, ur NEGATIVE  NEGATIVE mg/dL   Urobilinogen, UA 0.2  0.0 - 1.0 mg/dL   Nitrite NEGATIVE  NEGATIVE   Leukocytes, UA NEGATIVE  NEGATIVE  URINE MICROSCOPIC-ADD ON     Status: Abnormal   Collection Time  11/04/13  5:05 PM      Result Value Range   Squamous Epithelial / LPF FEW (*) RARE   WBC, UA 0-2  <3 WBC/hpf   RBC / HPF 0-2  <3 RBC/hpf   Bacteria, UA MANY (*) RARE   Urine-Other MUCOUS PRESENT    POCT PREGNANCY, URINE     Status: Abnormal   Collection Time    11/04/13  5:24 PM      Result Value Range   Preg Test, Ur POSITIVE (*) NEGATIVE  CBC     Status: Abnormal   Collection Time    11/04/13  6:35 PM      Result Value Range   WBC 10.0  4.0 - 10.5 K/uL   RBC 3.78 (*) 3.87 - 5.11 MIL/uL   Hemoglobin 10.1 (*) 12.0 - 15.0 g/dL   HCT 16.1 (*) 09.6 - 04.5 %   MCV 86.8  78.0 - 100.0 fL   MCH 26.7  26.0 - 34.0 pg   MCHC 30.8  30.0 - 36.0 g/dL   RDW 40.9 (*) 81.1 -  15.5 %   Platelets 297  150 - 400 K/uL  HCG, QUANTITATIVE, PREGNANCY     Status: Abnormal   Collection Time    11/04/13  6:35 PM      Result Value Range   hCG, Beta Chain, Quant, S 1722 (*) <5 mIU/mL   US Ob Comp Less 14 Wks  11/04/2013   CLINICAL DATA:  Rule out ectopic. Vaginal bleeding. Beta HCG is 1,722. EDC by LMP is 06/20/2014. Gestational age by LMP is 7 weeks 3 days. Uncertain LMP of 09/13/2013.  EXAM: OBSTETRIC <14 WK [place] AND TRANSVAGINAL OB US  TECHNIQUE: Both transabdominal and transvaginal ultrasound examinations were performed for complete evaluation of the gestation as well as the maternal uterus, adnexal regions, and pelvic cul-de-sac. Transvaginal technique was performed to assess early pregnancy.  COMPARISON:  None.  FINDINGS: Intrauterine gestational sac: Present  Yolk sac:  Not seen  Embryo:  Not seen  Cardiac Activity: Not seen  MSD:  7.3  mm   5 w   2  d  Maternal uterus/adnexae: No subchorionic hemorrhage identified. The right ovary has a normal appearance. Probable left corpus luteum cyst is present.  IMPRESSION: 1. Probable early intrauterine gestational sac, but no yolk sac, fetal pole, or cardiac activity yet visualized. Recommend follow-up quantitative B-HCG levels and follow-up US in 14 days to confirm and assess viability. This recommendation follows SRU consensus guidelines: Diagnostic Criteria for Nonviable Pregnancy Early in the First Trimester. Malva Limes Med 2013; 914:7829-56. 2. Normal appearance of the ovaries.   Electronically Signed   By: Rosalie Gums M.D.   On: 11/04/2013 19:18    Assessment and Plan   1. Abdominal pain in pregnancy, antepartum   2. BV (bacterial vaginosis)   Discharged patient prior to wet prep results coming back d/t needing to leave to pick up child. Rx sent for flagyl based on appearance and odor of discharge. Will call with results when wet prep complete.  Pt will return to MAU on Tuesday for repeat quant, has appointment at [facility] for NOB on  Tuesday morning, may choose to do follow up there if that is what her provider recommends.     Medication List         ferrous sulfate 325 (65 FE) MG tablet  Take 325 mg by mouth daily.     metroNIDAZOLE 500 MG tablet  Commonly known as:  FLAGYL  Take 1 tablet (500 mg total) by mouth 2 (two) times daily.     ofloxacin 0.3 % ophthalmic solution  Commonly known as:  OCUFLOX  Place 1 drop into both eyes 3 (three) times daily. Pt started ~ 10/21/13            Follow-up Information   Follow up with [facility] [place] [facility] On 11/07/2013.   Contact information:   [id number] [place] [address] [phone]        [facility] 11/04/2013, 5:58 PM

## 2013-11-04 NOTE — MAU Note (Signed)
Pt states two nights ago had brown tinged vaginal discharge, was concerned she may be miscarrying. No bleeding or abnormal vaginal d/c at present per pt. Has been having intermittent abd cramping. Is unsure if this is normal. Pain is in middle of lower abdomen. Feels like an aching pain. Pain wraps around to lower back.

## 2013-11-04 NOTE — MAU Note (Signed)
Pain for 4-5 days. Has taken tylenol with no help.

## 2013-11-06 LAB — GC/CHLAMYDIA PROBE AMP
CT Probe RNA: NEGATIVE
GC Probe RNA: NEGATIVE

## 2013-11-07 ENCOUNTER — Ambulatory Visit: Payer: Medicaid Other | Admitting: Family Medicine

## 2013-11-07 NOTE — MAU Provider Note (Signed)
Attestation of Attending Supervision of Advanced Practitioner (CNM/NP): Evaluation and management procedures were performed by the Advanced Practitioner under my supervision and collaboration.  I have reviewed the Advanced Practitioner's note and chart, and I agree with the management and plan.  Velma Hanna 11/07/2013 9:34 AM   

## 2013-11-10 ENCOUNTER — Ambulatory Visit (INDEPENDENT_AMBULATORY_CARE_PROVIDER_SITE_OTHER): Payer: Medicaid Other | Admitting: Family Medicine

## 2013-11-10 ENCOUNTER — Encounter: Payer: Self-pay | Admitting: Family Medicine

## 2013-11-10 VITALS — BP 116/65 | HR 110 | Temp 99.0°F | Ht 67.0 in | Wt 297.0 lb

## 2013-11-10 DIAGNOSIS — Z3480 Encounter for supervision of other normal pregnancy, unspecified trimester: Secondary | ICD-10-CM

## 2013-11-10 DIAGNOSIS — Z3481 Encounter for supervision of other normal pregnancy, first trimester: Secondary | ICD-10-CM

## 2013-11-10 DIAGNOSIS — N912 Amenorrhea, unspecified: Secondary | ICD-10-CM

## 2013-11-10 LAB — POCT URINE PREGNANCY: Preg Test, Ur: POSITIVE

## 2013-11-10 NOTE — Assessment & Plan Note (Addendum)
[facility] - requesting referral to [facility] [place] [facility], advised that she could be followed here but she would rather return where she was followed for her last pregnancy - referral submitted - gave pregnancy verification form

## 2013-11-10 NOTE — Progress Notes (Signed)
  Subjective:    Patient ID: Brandy Foster, female    DOB: October 17, 1976, 37 y.o.   MRN: [id number]  HPI Pt presents for confirmation of pregnancy and referral to obgyn. She is G5P2 with an EDD of 06/20/14. She is not having any pain, bleeding, LOF, contractions.   Review of Systems  Eyes: Negative for visual disturbance.  Respiratory: Negative for shortness of breath.   Cardiovascular: Positive for leg swelling. Negative for chest pain.  All other systems reviewed and are negative.       Objective:   Physical Exam  Nursing note and vitals reviewed. Constitutional: She is oriented to person, place, and time. She appears well-developed and well-nourished. No distress.  HENT:  Head: Normocephalic and atraumatic.  Right Ear: External ear normal.  Left Ear: External ear normal.  Nose: Nose normal.  Eyes: Conjunctivae are normal. Right eye exhibits no discharge. Left eye exhibits no discharge. No scleral icterus.  Pulmonary/Chest: Effort normal.  Neurological: She is alert and oriented to person, place, and time.  Skin: Skin is warm and dry. No rash noted. She is not diaphoretic.  Psychiatric: She has a normal mood and affect. Her behavior is normal.          Assessment & Plan:

## 2013-11-10 NOTE — Patient Instructions (Signed)
Pregnancy, First Trimester °The first trimester is the first 3 months your baby is growing inside you. It is important to follow your doctor's instructions. °HOME CARE  °· Do not smoke. °· Do not drink alcohol. °· Only take medicine as told by your doctor. °· Exercise. °· Eat healthy foods. Eat regular, well-balanced meals. °· You can have sex (intercourse) if there are no other problems with the pregnancy. °· Things that help with morning sickness: °· Eat soda crackers before getting up in the morning. °· Eat 4 to 5 small meals rather than 3 large meals. °· Drink liquids between meals, not during meals. °· Go to all appointments as told. °· Take all vitamins or supplements as told by your doctor. °GET HELP RIGHT AWAY IF:  °· You develop a fever. °· You have a bad smelling fluid that is leaking from your vagina. °· There is bleeding from the vagina. °· You develop severe belly (abdominal) or back pain. °· You throw up (vomit) blood. It may look like coffee grounds. °· You lose more than 2 pounds in a week. °· You gain 5 pounds or more in a week. °· You gain more than 2 pounds in a week and you see puffiness (swelling) in your feet, ankles, or legs. °· You have severe dizziness or pass out (faint). °· You are around people who have German measles, chickenpox, or fifth disease. °· You have a headache, watery poop (diarrhea), pain with peeing (urinating), or cannot breath right. °Document Released: 05/25/2008 Document Revised: 02/29/2012 Document Reviewed: 05/25/2008 °ExitCare® Patient Information ©2014 [employer]. ° °

## 2013-11-21 ENCOUNTER — Telehealth: Payer: Self-pay | Admitting: Family Medicine

## 2013-11-21 NOTE — Telephone Encounter (Signed)
Pt called because her referral for obgyn was put on epic, but the place where she is going is not on epic and we need to send a fax or do a verbal to them jw

## 2013-11-22 ENCOUNTER — Inpatient Hospital Stay (HOSPITAL_COMMUNITY): Payer: Medicaid Other

## 2013-11-22 ENCOUNTER — Encounter (HOSPITAL_COMMUNITY): Payer: Self-pay | Admitting: *Deleted

## 2013-11-22 ENCOUNTER — Inpatient Hospital Stay (HOSPITAL_COMMUNITY)
Admission: AD | Admit: 2013-11-22 | Discharge: 2013-11-22 | Disposition: A | Discharge status: Home / Self Care | Payer: Medicaid Other | Source: Ambulatory Visit | Attending: Obstetrics & Gynecology | Admitting: Obstetrics & Gynecology

## 2013-11-22 DIAGNOSIS — O021 Missed abortion: Secondary | ICD-10-CM | POA: Insufficient documentation

## 2013-11-22 DIAGNOSIS — O039 Complete or unspecified spontaneous abortion without complication: Secondary | ICD-10-CM

## 2013-11-22 DIAGNOSIS — R109 Unspecified abdominal pain: Secondary | ICD-10-CM | POA: Insufficient documentation

## 2013-11-22 LAB — HCG, QUANTITATIVE, PREGNANCY: hCG, Beta Chain, Quant, S: 2962 m[IU]/mL — ABNORMAL HIGH (ref <5)

## 2013-11-22 MED ORDER — PROMETHAZINE HCL 25 MG PO TABS: 25.0000 mg | ORAL_TABLET | Freq: Four times a day (QID) | ORAL | Status: DC | PRN

## 2013-11-22 MED ORDER — ACETAMINOPHEN 325 MG PO TABS: 650.0000 mg | ORAL_TABLET | Freq: Once | ORAL | Status: DC

## 2013-11-22 MED ORDER — OXYCODONE-ACETAMINOPHEN 5-325 MG PO TABS: 2.0000 | ORAL_TABLET | ORAL | Status: DC | PRN

## 2013-11-22 NOTE — MAU Note (Signed)
Patient is in with c/o of new onset bleeding (started brown color on Monday, now its red) and lower abdominal cramping.

## 2013-11-22 NOTE — MAU Provider Note (Signed)
History     CSN: [id number]  Arrival date and time: 11/22/13 1631   First Provider Initiated Contact with Patient 11/22/13 1729      Chief Complaint  Patient presents with  . Vaginal Bleeding  . Abdominal Cramping   HPI Comments: Brandy Foster 37 y.o. [id number] presents to MAU with vaginal bleeding in pregnancy at 10 weeks. She had a similar episode on 11/04/13 and did not return for repeat BHCG or ultrasound.  She had stopped bleeding until 3 days ago and she started again. She changes her pad 1-2 times per day. She plans her OB care at [facility]     Vaginal Bleeding Associated symptoms include abdominal pain.  Abdominal Cramping      Past Medical History  Diagnosis Date  . Anemia   . Depression   . Anxiety     Past Surgical History  Procedure Laterality Date  . Cesarean section      Family History  Problem Relation Age of Onset  . Diabetes Mother   . COPD Mother   . Arthritis Mother   . Hypertension Mother   . Hyperlipidemia Father   . Schizophrenia Brother     History  Substance Use Topics  . Smoking status: Never Smoker   . Smokeless tobacco: Never Used  . Alcohol Use: No    Allergies: No Known Allergies  Prescriptions prior to admission  Medication Sig Dispense Refill  . ferrous sulfate 325 (65 FE) MG tablet Take 325 mg by mouth daily.        Review of Systems  Constitutional: Negative.   HENT: Negative.   Eyes: Negative.   Respiratory: Negative.   Cardiovascular: Negative.   Gastrointestinal: Positive for abdominal pain.  Genitourinary: Negative.        Vaginal bleeding  Musculoskeletal: Negative.   Skin: Negative.   Neurological: Negative.   Psychiatric/Behavioral: The patient is nervous/anxious.    Physical Exam   Blood pressure 115/64, pulse 87, temperature 98.1 F (36.7 C), temperature source Oral, resp. rate 18, last menstrual period 09/13/2013.  Physical Exam  Constitutional: She is oriented to person, place, and time. She  appears well-developed and well-nourished. No distress.  Eyes: Pupils are equal, round, and reactive to light.  Cardiovascular: Normal rate, regular rhythm and normal heart sounds.   Respiratory: Effort normal and breath sounds normal.  GI: Soft. Bowel sounds are normal.  Genitourinary:  Pt has extreme anxiety about speculum exam. She could not relax enough to get more than a quick look. She was shaking and crying. There was blood in vault, otherwise unsure of findings  Musculoskeletal: Normal range of motion.  Neurological: She is alert and oriented to person, place, and time.  Skin: Skin is warm and dry.  Psychiatric: Her mood appears anxious.  tearful   Results for orders placed during the hospital encounter of 11/22/13 (from the past 24 hour(s))  HCG, QUANTITATIVE, PREGNANCY     Status: Abnormal   Collection Time    11/22/13  6:58 PM      Result Value Range   hCG, Beta Chain, Quant, S 2962 (*) <5 mIU/mL   US Ob Transvaginal  11/22/2013   CLINICAL DATA:  Bleeding.  EXAM: TRANSVAGINAL OB ULTRASOUND  TECHNIQUE: Transvaginal ultrasound was performed for complete evaluation of the gestation as well as the maternal uterus, adnexal regions, and pelvic cul-de-sac.  COMPARISON:  11/04/2013  FINDINGS: Intrauterine gestational sac: Present  Yolk sac:  Present  Embryo:  No  Cardiac Activity: No  Heart Rate: Not applicable bpm  MSD: 9.3 mm (previously 7.3 mm) 5 w   4  d  Findings are suspicious but not yet definitive for failed pregnancy. Recommend follow-up US in 10-14 days for definitive diagnosis. This recommendation follows SRU consensus guidelines: Diagnostic Criteria for Nonviable Pregnancy Early in the First Trimester. Malva Limes Med 2013; 161:0960-45.  Maternal uterus/adnexae:  Subchorionic hemorrhage: Small  Right ovary: Not visualized  Left ovary: Normal  Other :Patient did not tolerate transvaginal ultrasound secondary to pain.  Free fluid: None  IMPRESSION: 1. Single intrauterine gestational  sac containing a yolk sac but no embryo. It has been greater then 2 weeks since the initial scan demonstrating a gestational sac without yolk sac. Findings meet definitive criteria for failed pregnancy. This follows SRU consensus guidelines: Diagnostic Criteria for Nonviable Pregnancy Early in the First Trimester. Macy Mis J Med [id number]. 2. Small subchorionic hemorrhage.   Electronically Signed   By: Signa Kell M.D.   On: 11/22/2013 20:02    MAU Course  Procedures  MDM  Repeat BHCG/ Ultrasound  Assessment and Plan   A: Failed pregnancy  P: Labs, U/S Offered options of waiting, cytotec or D&C She would like to wait and pass POC naturally Percocet/ Phenergan for pain Follow up Quant Sunday   Carolynn Serve 11/22/2013, 6:45 PM

## 2013-11-23 NOTE — MAU Provider Note (Signed)
Attestation of Attending Supervision of Advanced Practitioner: Evaluation and management procedures were performed by the PA/NP/CNM/OB Fellow under my supervision/collaboration. Chart reviewed and agree with management and plan.  Morgan Keinath V 11/23/2013 11:18 AM

## 2013-11-26 ENCOUNTER — Inpatient Hospital Stay (HOSPITAL_COMMUNITY)
Admission: AD | Admit: 2013-11-26 | Discharge: 2013-11-26 | Disposition: A | Discharge status: Home / Self Care | Payer: Medicaid Other | Source: Ambulatory Visit | Attending: Obstetrics & Gynecology | Admitting: Obstetrics & Gynecology

## 2013-11-26 DIAGNOSIS — O039 Complete or unspecified spontaneous abortion without complication: Secondary | ICD-10-CM

## 2013-11-26 DIAGNOSIS — O0281 Inappropriate change in quantitative human chorionic gonadotropin (hCG) in early pregnancy: Secondary | ICD-10-CM

## 2013-11-26 DIAGNOSIS — O209 Hemorrhage in early pregnancy, unspecified: Secondary | ICD-10-CM | POA: Insufficient documentation

## 2013-11-26 LAB — HCG, QUANTITATIVE, PREGNANCY: hCG, Beta Chain, Quant, S: 1381 m[IU]/mL — ABNORMAL HIGH

## 2013-11-26 NOTE — MAU Provider Note (Signed)
Attestation of Attending Supervision of Advanced Practitioner (CNM/NP): Evaluation and management procedures were performed by the Advanced Practitioner under my supervision and collaboration.  I have reviewed the Advanced Practitioner's note and chart, and I agree with the management and plan.  HARRAWAY-SMITH, Briget Shaheed 10:40 PM

## 2013-11-26 NOTE — MAU Note (Signed)
Pt in for repeat BHCG, continues to have cramping and bleeding.  The bleeding is heavier, passing a few clots.

## 2013-11-26 NOTE — MAU Note (Signed)
Provider discussing plan of care with pt.

## 2013-11-26 NOTE — MAU Provider Note (Signed)
HPI:  Ms. Brandy Foster is a 37 y.o. female [phone] at [redacted]w[redacted]d who presents for a repeat beta hcg level. Pt was informed that she was having a miscarriage and she chose to do expectant management. Today she continues to have bleeding with mild cramping.    Objective:  GENERAL: Well-developed, well-nourished female in no acute distress.  HEENT: Normocephalic, atraumatic.   LUNGS: Effort normal HEART: Regular rate  SKIN: Warm, dry and without erythema PSYCH: Normal mood and affect  Filed Vitals:   11/26/13 1918  BP: 117/54  Pulse: 84  Temp: 98.1 F (36.7 C)  TempSrc: Oral  Resp: 18  Height: 5\' 7"  (1.702 m)  Weight: 130.908 kg (288 lb 9.6 oz)   CLINICAL DATA: Bleeding.  EXAM:  TRANSVAGINAL OB ULTRASOUND  TECHNIQUE:  Transvaginal ultrasound was performed for complete evaluation of the  gestation as well as the maternal uterus, adnexal regions, and  pelvic cul-de-sac.  COMPARISON: 11/04/2013  FINDINGS:  Intrauterine gestational sac: Present  Yolk sac: Present  Embryo: No  Cardiac Activity: No  Heart Rate: Not applicable bpm  MSD: 9.3 mm (previously 7.3 mm) 5 w 4 d  Findings are suspicious but not yet definitive for failed pregnancy.  Recommend follow-up US in 10-14 days for definitive diagnosis. This  recommendation follows SRU consensus guidelines: Diagnostic Criteria  for Nonviable Pregnancy Early in the First Trimester. Malva Limes Med  2013; 191:4782-95.  Maternal uterus/adnexae:  Subchorionic hemorrhage: Small  Right ovary: Not visualized  Left ovary: Normal  Other :Patient did not tolerate transvaginal ultrasound secondary to  pain.  Free fluid: None  IMPRESSION:  1. Single intrauterine gestational sac containing a yolk sac but no  embryo. It has been greater then 2 weeks since the initial scan  demonstrating a gestational sac without yolk sac. Findings meet  definitive criteria for failed pregnancy. This follows SRU consensus  guidelines: Diagnostic Criteria for  Nonviable Pregnancy Early in the  First Trimester. Macy Mis J Med [phone].  2. Small subchorionic hemorrhage.  Electronically Signed  By: Signa Kell M.D.  On: 11/22/2013 20:02    MDM: Garey Ham Hcg Level 12/3: 2962 12/7: 1381   A: 1. Inappropriate change in quantitative hCG in early pregnancy   2.  Vaginal bleeding in pregnancy   P: Discharge home Bleeding precautions discussed at length Return to MAU as needed, if symptoms worsen Referral made to clinic; pt to follow up in 1-2 weeks.   Iona Hansen Rasch, NP 11/26/2013 7:52 PM

## 2013-12-11 ENCOUNTER — Encounter: Payer: Medicaid Other | Admitting: Obstetrics and Gynecology

## 2013-12-13 ENCOUNTER — Other Ambulatory Visit: Payer: Medicaid Other

## 2013-12-19 ENCOUNTER — Other Ambulatory Visit: Payer: Medicaid Other

## 2013-12-26 ENCOUNTER — Other Ambulatory Visit: Payer: Medicaid Other

## 2013-12-26 DIAGNOSIS — O0281 Inappropriate change in quantitative human chorionic gonadotropin (hCG) in early pregnancy: Secondary | ICD-10-CM

## 2013-12-27 LAB — HCG, QUANTITATIVE, PREGNANCY: hCG, Beta Chain, Quant, S: 4.4 m[IU]/mL

## 2014-06-12 ENCOUNTER — Ambulatory Visit (INDEPENDENT_AMBULATORY_CARE_PROVIDER_SITE_OTHER): Payer: Medicaid Other | Admitting: Family Medicine

## 2014-06-12 ENCOUNTER — Other Ambulatory Visit (HOSPITAL_COMMUNITY)
Admission: RE | Admit: 2014-06-12 | Discharge: 2014-06-12 | Disposition: A | Discharge status: Home / Self Care | Payer: Medicaid Other | Source: Ambulatory Visit | Attending: Family Medicine | Admitting: Family Medicine

## 2014-06-12 ENCOUNTER — Encounter: Payer: Self-pay | Admitting: Family Medicine

## 2014-06-12 VITALS — BP 122/83 | HR 83 | Temp 98.5°F | Wt 293.0 lb

## 2014-06-12 DIAGNOSIS — D500 Iron deficiency anemia secondary to blood loss (chronic): Secondary | ICD-10-CM

## 2014-06-12 DIAGNOSIS — Z01419 Encounter for gynecological examination (general) (routine) without abnormal findings: Secondary | ICD-10-CM | POA: Insufficient documentation

## 2014-06-12 DIAGNOSIS — Z0000 Encounter for general adult medical examination without abnormal findings: Secondary | ICD-10-CM

## 2014-06-12 DIAGNOSIS — Z1151 Encounter for screening for human papillomavirus (HPV): Secondary | ICD-10-CM | POA: Insufficient documentation

## 2014-06-12 DIAGNOSIS — R42 Dizziness and giddiness: Secondary | ICD-10-CM

## 2014-06-12 DIAGNOSIS — E669 Obesity, unspecified: Secondary | ICD-10-CM

## 2014-06-12 DIAGNOSIS — Z124 Encounter for screening for malignant neoplasm of cervix: Secondary | ICD-10-CM

## 2014-06-12 DIAGNOSIS — Z308 Encounter for other contraceptive management: Secondary | ICD-10-CM

## 2014-06-12 LAB — CBC
HCT: 34.7 % — ABNORMAL LOW (ref 36.0–46.0)
Hemoglobin: 10.8 g/dL — ABNORMAL LOW (ref 12.0–15.0)
MCH: 25.9 pg — ABNORMAL LOW (ref 26.0–34.0)
MCHC: 31.1 g/dL (ref 30.0–36.0)
MCV: 83.2 fL (ref 78.0–100.0)
Platelets: 358 10*3/uL (ref 150–400)
RBC: 4.17 MIL/uL (ref 3.87–5.11)
RDW: 16.8 % — ABNORMAL HIGH (ref 11.5–15.5)
WBC: 8.3 10*3/uL (ref 4.0–10.5)

## 2014-06-12 MED ORDER — ETONOGESTREL-ETHINYL ESTRADIOL 0.12-0.015 MG/24HR VA RING: 1.0000 | VAGINAL_RING | VAGINAL | Status: DC

## 2014-06-12 NOTE — Assessment & Plan Note (Signed)
-   [facility]I testing done at [facility] this year, HIV, GC/CT all negative - Denies pap in past 7 years, even while pregnant - Pap obtained today

## 2014-06-12 NOTE — Patient Instructions (Signed)

## 2014-06-12 NOTE — Assessment & Plan Note (Signed)
Sounds like orthostasis from dehydration - increase water intake - get up slowly and carefully

## 2014-06-12 NOTE — Assessment & Plan Note (Signed)
H/o iron deficiency anemia, taking supplement but forgets a lot - repeat CBC

## 2014-06-12 NOTE — Assessment & Plan Note (Signed)
Pt currently using condoms. Has used nuvaring and pills in the past, both were ok - prescribed nuvaring today

## 2014-06-12 NOTE — Assessment & Plan Note (Signed)
BMI 46. Pt trying to lose weight, discussed strategies - increase physical activity to 4-5 times per week for at least 30 minutes each - stop sweet drinks - will think about seeing nutritionist, not interested right now

## 2014-06-12 NOTE — Progress Notes (Signed)
   Subjective:    Patient ID: Brandy Foster, female    DOB: 1976-04-25, 38 y.o.   MRN: [id number]  Dizziness  Otalgia    Pt presents for annual exam  Only current complaint is occasional light-headedness on standing. Feels like she is going to faint. Drinks about 1 glass of water daily, otherwise drinks soda. Has been happening for about 2 months.   Review of Systems  HENT: Positive for ear pain.   Neurological: Positive for dizziness and light-headedness.  All other systems reviewed and are negative.      Objective:   Physical Exam  Nursing note and vitals reviewed. Constitutional: She is oriented to person, place, and time. She appears well-developed and well-nourished. No distress.  HENT:  Head: Normocephalic and atraumatic.  Eyes: Conjunctivae are normal. Right eye exhibits no discharge. Left eye exhibits no discharge. No scleral icterus.  Cardiovascular: Normal rate, regular rhythm, normal heart sounds and intact distal pulses.   No murmur heard. Pulmonary/Chest: Effort normal and breath sounds normal. No respiratory distress. She has no wheezes.  Abdominal: Soft. She exhibits no distension. There is no tenderness.  Genitourinary: Vagina normal and uterus normal. No labial fusion. There is no rash, tenderness, lesion or injury on the right labia. There is no rash, tenderness, lesion or injury on the left labia. Uterus is not deviated, not enlarged, not fixed and not tender. Cervix exhibits no motion tenderness, no discharge and no friability. No erythema, tenderness or bleeding around the vagina. No foreign body around the vagina. No signs of injury around the vagina. No vaginal discharge found.  Internal exam significantly limited by patient discomfort and clamping down on speculum. Was able visualize most of cervix, including os, but not as thoroughly as I would have liked  Musculoskeletal: Normal range of motion. She exhibits no edema and no tenderness.  Neurological: She is  alert and oriented to person, place, and time.  Skin: Skin is warm and dry. She is not diaphoretic.  Psychiatric: She has a normal mood and affect. Her behavior is normal.          Assessment & Plan:

## 2014-06-13 LAB — BASIC METABOLIC PANEL
BUN: 8 mg/dL (ref 6–23)
CO2: 22 mEq/L (ref 19–32)
Calcium: 9 mg/dL (ref 8.4–10.5)
Chloride: 105 mEq/L (ref 96–112)
Creat: 0.75 mg/dL (ref 0.50–1.10)
Glucose, Bld: 85 mg/dL (ref 70–99)
Potassium: 3.9 mEq/L (ref 3.5–5.3)
Sodium: 136 mEq/L (ref 135–145)

## 2014-06-13 LAB — LIPID PANEL
Cholesterol: 210 mg/dL — ABNORMAL HIGH (ref 0–200)
HDL: 54 mg/dL (ref >39)
LDL Cholesterol: 127 mg/dL — ABNORMAL HIGH (ref 0–99)
Total CHOL/HDL Ratio: 3.9 Ratio
Triglycerides: 143 mg/dL (ref <150)
VLDL: 29 mg/dL (ref 0–40)

## 2014-06-13 LAB — CYTOLOGY - PAP

## 2014-06-15 ENCOUNTER — Telehealth: Payer: Self-pay | Admitting: *Deleted

## 2014-06-15 NOTE — Telephone Encounter (Signed)
Message copied by Maryland Pink on Fri Jun 15, 2014 12:26 PM ------      Message from: Frazier Richards      Created: Fri Jun 15, 2014 10:47 AM       Please inform patient that her pap smear was normal and she will be due for her next pap in 5 years. Please also tell her that her hemoglobin remains low and she should continue to take her iron supplement, preferably with a glass of orange juice in the morning ------

## 2014-06-15 NOTE — Telephone Encounter (Signed)
Left message to return call. Please read message from Dr Sherril Cong to patient.[name], Brandy Foster

## 2014-06-18 NOTE — Telephone Encounter (Signed)
Patient informed. 

## 2014-10-22 ENCOUNTER — Encounter: Payer: Self-pay | Admitting: Family Medicine

## 2014-12-30 ENCOUNTER — Inpatient Hospital Stay (HOSPITAL_COMMUNITY)
Admission: AD | Admit: 2014-12-30 | Discharge: 2014-12-31 | Discharge status: Against Medical Advice | Payer: Medicare Other | Source: Ambulatory Visit | Attending: Obstetrics & Gynecology | Admitting: Obstetrics & Gynecology

## 2014-12-30 DIAGNOSIS — R109 Unspecified abdominal pain: Secondary | ICD-10-CM | POA: Diagnosis not present

## 2014-12-30 DIAGNOSIS — N939 Abnormal uterine and vaginal bleeding, unspecified: Secondary | ICD-10-CM | POA: Insufficient documentation

## 2014-12-30 NOTE — MAU Note (Signed)
Vaginal bleeding since yesterday, increased tonight. Abdominal pain tonight. LMP 12/04/14.

## 2014-12-31 ENCOUNTER — Encounter (HOSPITAL_COMMUNITY): Payer: Self-pay | Admitting: *Deleted

## 2014-12-31 DIAGNOSIS — N939 Abnormal uterine and vaginal bleeding, unspecified: Secondary | ICD-10-CM | POA: Diagnosis not present

## 2014-12-31 LAB — URINALYSIS, ROUTINE W REFLEX MICROSCOPIC
Bilirubin Urine: NEGATIVE
Glucose, UA: NEGATIVE mg/dL
Ketones, ur: NEGATIVE mg/dL
Leukocytes, UA: NEGATIVE
Nitrite: NEGATIVE
Protein, ur: 30 mg/dL — AB
Specific Gravity, Urine: 1.02 (ref 1.005–1.030)
Urobilinogen, UA: 0.2 mg/dL (ref 0.0–1.0)
pH: 7.5 (ref 5.0–8.0)

## 2014-12-31 LAB — URINE MICROSCOPIC-ADD ON

## 2014-12-31 NOTE — MAU Note (Signed)
Pt had LMP 04 Dec 2014, started bleeding with low abd cramping tonight.  Neg UPT.

## 2015-04-17 ENCOUNTER — Ambulatory Visit (HOSPITAL_COMMUNITY)
Admission: RE | Admit: 2015-04-17 | Discharge: 2015-04-17 | Disposition: A | Discharge status: Home / Self Care | Payer: Medicare Other | Source: Ambulatory Visit | Attending: Family Medicine | Admitting: Family Medicine

## 2015-04-17 ENCOUNTER — Encounter: Payer: Self-pay | Admitting: Family Medicine

## 2015-04-17 ENCOUNTER — Ambulatory Visit (INDEPENDENT_AMBULATORY_CARE_PROVIDER_SITE_OTHER): Payer: Medicare Other | Admitting: Family Medicine

## 2015-04-17 VITALS — BP 121/84 | HR 85 | Temp 98.3°F | Wt 298.0 lb

## 2015-04-17 DIAGNOSIS — R42 Dizziness and giddiness: Secondary | ICD-10-CM | POA: Insufficient documentation

## 2015-04-17 DIAGNOSIS — Z862 Personal history of diseases of the blood and blood-forming organs and certain disorders involving the immune mechanism: Secondary | ICD-10-CM | POA: Diagnosis present

## 2015-04-17 LAB — ANEMIA PANEL 7
%SAT: 10 % — ABNORMAL LOW (ref 20–55)
ABS Retic: 57.3 10*3/uL (ref 19.0–186.0)
Ferritin: 6 ng/mL — ABNORMAL LOW (ref 10–291)
Folate: 7 ng/mL
HCT: 31 % — ABNORMAL LOW (ref 36.0–46.0)
Hemoglobin: 9.6 g/dL — ABNORMAL LOW (ref 12.0–15.0)
Iron: 38 ug/dL — ABNORMAL LOW (ref 42–145)
MCH: 25.1 pg — ABNORMAL LOW (ref 26.0–34.0)
MCHC: 31 g/dL (ref 30.0–36.0)
MCV: 81.2 fL (ref 78.0–100.0)
MPV: 9.6 fL (ref 8.6–12.4)
Platelets: 346 10*3/uL (ref 150–400)
RBC.: 3.82 MIL/uL — ABNORMAL LOW (ref 3.87–5.11)
RBC: 3.82 MIL/uL — ABNORMAL LOW (ref 3.87–5.11)
RDW: 16.1 % — ABNORMAL HIGH (ref 11.5–15.5)
Retic Ct Pct: 1.5 % (ref 0.4–2.3)
TIBC: 396 ug/dL (ref 250–470)
UIBC: 358 ug/dL (ref 125–400)
Vitamin B-12: 342 pg/mL (ref 211–911)
WBC: 9.5 10*3/uL (ref 4.0–10.5)

## 2015-04-17 LAB — BASIC METABOLIC PANEL
BUN: 9 mg/dL (ref 6–23)
CO2: 24 mEq/L (ref 19–32)
Calcium: 8.6 mg/dL (ref 8.4–10.5)
Chloride: 107 mEq/L (ref 96–112)
Creat: 0.67 mg/dL (ref 0.50–1.10)
Glucose, Bld: 81 mg/dL (ref 70–99)
Potassium: 4.2 mEq/L (ref 3.5–5.3)
Sodium: 138 mEq/L (ref 135–145)

## 2015-04-17 LAB — POCT URINE PREGNANCY: Preg Test, Ur: NEGATIVE

## 2015-04-17 NOTE — Assessment & Plan Note (Signed)
Patient with positive orthostatics today. Possibly dehydration versus anemia, possibly due from heavy menses. EKG today normal. Urine pregnancy negative. Will complete anemia panel, CBC and BMP today. Encouraged patient to restart her iron supplementation twice a day, is me relax if she finds herself becoming constipated. Urged patient to drink 70-80 ounces of water daily. Caution with standing, patient should take her time, holding on in the standing position until nonsymptomatic. She will be called with lab results Follow-up in 1-2 weeks

## 2015-04-17 NOTE — Patient Instructions (Addendum)
Orthostatic Hypotension Orthostatic hypotension is a sudden drop in blood pressure. It happens when you quickly stand up from a seated or lying position. You may feel dizzy or light-headed. This can last for just a few seconds or for up to a few minutes. It is usually not a serious problem. However, if this happens frequently or gets worse, it can be a sign of something more serious. CAUSES  Different things can cause orthostatic hypotension, including:   Loss of body fluids (dehydration).  Medicines that lower blood pressure.  Sudden changes in posture, such as standing up quickly after you have been sitting or lying down.  Taking too much of your medicine. SIGNS AND SYMPTOMS   Light-headedness or dizziness.   Fainting or near-fainting.   A fast heart rate.   Weakness.   Feeling tired (fatigue).  DIAGNOSIS  Your health care provider may do several things to help diagnose your condition and identify the cause. These may include:   Taking a medical history and doing a physical exam.  Checking your blood pressure. Your health care provider will check your blood pressure when you are:  Lying down.  Sitting.  Standing.  Using tilt table testing. In this test, you lie down on a table that moves from a lying position to a standing position. You will be strapped onto the table. This test monitors your blood pressure and heart rate when you are in different positions. TREATMENT  Treatment will vary depending on the cause. Possible treatments include:   Changing the dosage of your medicines.  Wearing compression stockings on your lower legs.  Standing up slowly after sitting or lying down.  Eating more salt.  Eating frequent, small meals.  In some cases, getting IV fluids.  Taking medicine to enhance fluid retention. HOME CARE INSTRUCTIONS  Only take over-the-counter or prescription medicines as directed by your health care provider.  Follow your health care  provider's instructions for changing the dosage of your current medicines.  Do not stop or adjust your medicine on your own.  Stand up slowly after sitting or lying down. This allows your body to adjust to the different position.  Wear compression stockings as directed.  Eat extra salt as directed.  Do not add extra salt to your diet unless directed to by your health care provider.  Eat frequent, small meals.  Avoid standing suddenly after eating.  Avoid hot showers or excessive heat as directed by your health care provider.  Keep all follow-up appointments. SEEK MEDICAL CARE IF:  You continue to feel dizzy or light-headed after standing.  You feel groggy or confused.  You feel cold, clammy, or sick to your stomach (nauseous).  You have blurred vision.  You feel short of breath. SEEK IMMEDIATE MEDICAL CARE IF:   You faint after standing.  You have chest pain.  You have difficulty breathing.   You lose feeling or movement in your arms or legs.   You have slurred speech or difficulty talking, or you are unable to talk.  MAKE SURE YOU:   Understand these instructions.  Will watch your condition.  Will get help right away if you are not doing well or get worse. Document Released: 11/27/2002 Document Revised: 12/12/2013 Document Reviewed: 09/29/2013 [facility] Patient Information 2015 [place], [place]. This information is not intended to replace advice given to you by your health care provider. Make sure you discuss any questions you have with your health care provider.  I am going to collect some blood  work for me today, your dizziness could be coming from your history of anemia. Your EKG looked normal today so I do not believe it is your heart. Urine pregnancy is negative today. I will call you as soon as lab work comes back, we will discuss what we need to do from there. In the meantime I would restart your iron supplementation and drink at least 70-80 ounces of  water a day.

## 2015-04-17 NOTE — Progress Notes (Signed)
Subjective:    Patient ID: Brandy Foster, female    DOB: 11-Dec-1976, 39 y.o.   MRN: [id number]  HPI  Lightheadedness: Patient presents to family medicine clinic for same day appointment for 3-6 months history of dizziness, that acutely became worse on Monday (2 days ago). She states that when she stood up she got very dizzy and lasted for approximately 2 hours, at this time she also felt nauseated. She reports she had a little bit a left arm pain, that only lasted a minute. She also endorses a frontal headache at that time that she took a BC powder and it went away, and she felt normal later that day. She states she feels like she is going to pass out, not so much dizzy. Patient states the majority of her symptoms occur when changing position, especially standing. She has a history of iron deficiency anemia in the past, and is not very compliant with her iron medication. Her last menstrual period was April 13, she states that her periods are a little heavy, going through a pad sometimes every 2 hours. Her periods are regular having one approximately every 30 days and last 6 days. Patient is not currently on any medications, including birth control. She does take BC powders a couple times a week. She denies any fever, fatigue, chest pain, shortness of breath or palpitations. She denies any cardiac history.  Never smoker Past Medical History  Diagnosis Date  . Anemia   . Depression   . Anxiety    No Known Allergies Past Surgical History  Procedure Laterality Date  . Cesarean section     History   Social History  . Marital Status: Single    Spouse Name: N/A  . Number of Children: N/A  . Years of Education: N/A   Occupational History  . Not on file.   Social History Main Topics  . Smoking status: Never Smoker   . Smokeless tobacco: Never Used  . Alcohol Use: No  . Drug Use: No  . Sexual Activity: Yes    Birth Control/ Protection: None   Other Topics Concern  . Not on file    Social History Narrative   On disability- not sure what her disability is- for mental  Health reasons.  Unsure of diagnosis.     Mother gets disability check and gives pt the money. - mom pays bills and then gives her the rest.    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    Lives at rental home- has 2 children (ages 87 (melanie) and 79 (jabaree))              Review of Systems Per history of present illness    Objective:   Physical Exam BP 121/84 mmHg  Pulse 85  Temp(Src) 98.3 F (36.8 C) (Oral)  Wt 298 lb (135.172 kg)  LMP 04/03/2015 Gen: NAD. Nontoxic in appearance, very well-groomed, African-American female, pleasant, alert, oriented. Morbidly obese HEENT: AT. Waterproof. Bilateral TM visualized and normal in appearance. Bilateral eyes without injections or icterus. Tachycardia mucous membranes. Bilateral nares mild erythema and swelling. Throat without erythema or exudates.  CV: RRR no murmurs Chest: CTAB, no wheeze or crackles Abd: Soft. Morbidly obese. NTND. BS present. No Masses palpated.  Ext: No erythema. No edema. +2/4 PT Skin: No rashes, purpura or petechiae.  Neuro: Normal gait. PERLA. EOMi. Alert. Cranial nerves II through XII intact, muscle  strength 5/5 upper and lower extremity. No focal deficits. Psych: Normal affect dress and demeanor. Normal speech   EKG: Normal sinus rhythm, heart rate 75. Positive orthostatics with greater than 10 increase in diastolic blood pressure, and symptomatic.     Assessment & Plan:

## 2015-04-19 ENCOUNTER — Encounter: Payer: Self-pay | Admitting: Family Medicine

## 2015-04-19 ENCOUNTER — Telehealth: Payer: Self-pay | Admitting: Family Medicine

## 2015-04-19 NOTE — Telephone Encounter (Signed)
Attempted to call pt to discuss lab work. Labs are consistent with iron deficiency  Anemia. She should be encouraged to continue the iron supplementation at least twice a day, TID would be better. Use miralax if she experiences constipation. Follow up 1-2 weeks as dicussed on dizziness if no improvement.  Howard Pouch DO PGY3 [facility]

## 2015-04-22 ENCOUNTER — Telehealth: Payer: Self-pay | Admitting: Family Medicine

## 2015-04-22 NOTE — Telephone Encounter (Signed)
Called patient to discuss her lab work again today. Patient will start taking her iron 3 times a day, use Mira lax if she's having constipation. She has an appointment in a few weeks with her primary care provider to discuss causes of iron deficiency anemia, and possible workup if necessary.

## 2015-05-01 ENCOUNTER — Ambulatory Visit: Payer: Medicare Other | Admitting: Family Medicine

## 2015-05-07 DIAGNOSIS — Z862 Personal history of diseases of the blood and blood-forming organs and certain disorders involving the immune mechanism: Secondary | ICD-10-CM | POA: Insufficient documentation

## 2015-05-09 IMAGING — US US OB COMP LESS 14 WK
1 series · 13 of 28 positions shown · non-contrast
Comparison: None.

CLINICAL DATA: Rule out ectopic. Vaginal bleeding. Beta HCG is
[DATE]. EDC by LMP is 06/20/2014. Gestational age by LMP is 7 weeks 3
days. Uncertain LMP of 09/13/2013.

EXAM:
OBSTETRIC <14 WK US AND TRANSVAGINAL OB US
TECHNIQUE: Both transabdominal and transvaginal ultrasound examinations were
performed for complete evaluation of the gestation as well as the
maternal uterus, adnexal regions, and pelvic cul-de-sac.
Transvaginal technique was performed to assess early pregnancy.

[Series 1: us ob comp less 14 wks · 13 of 32 slices shown]
[im 2/32]
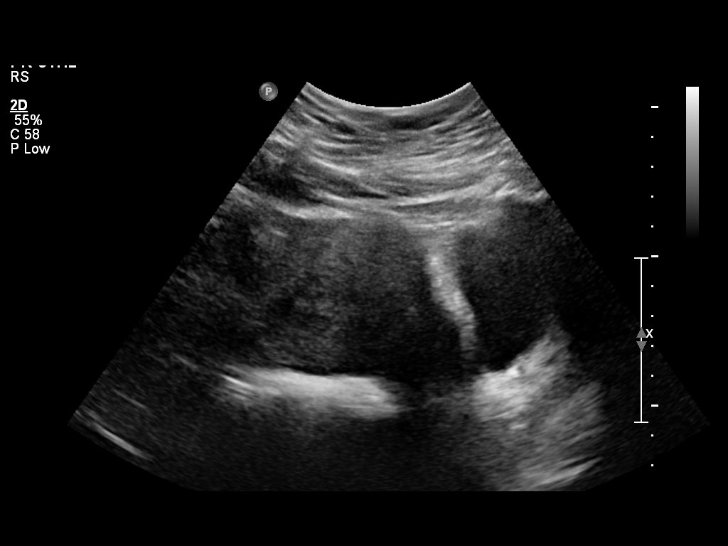
[im 4/32]
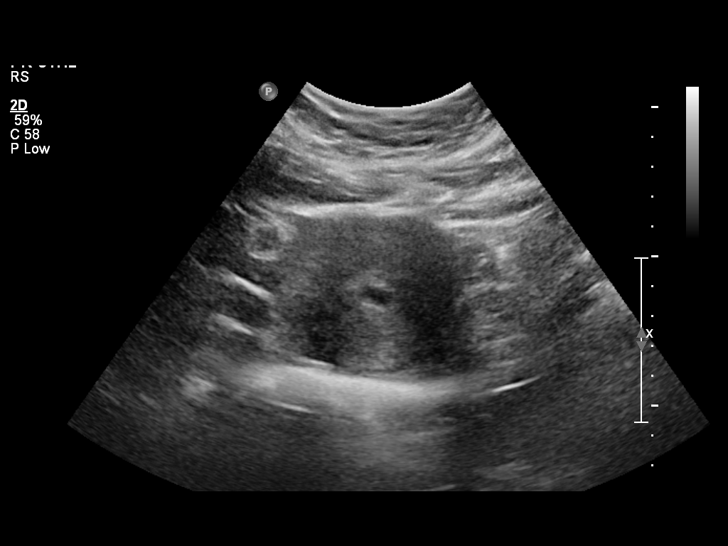
[im 6/32]
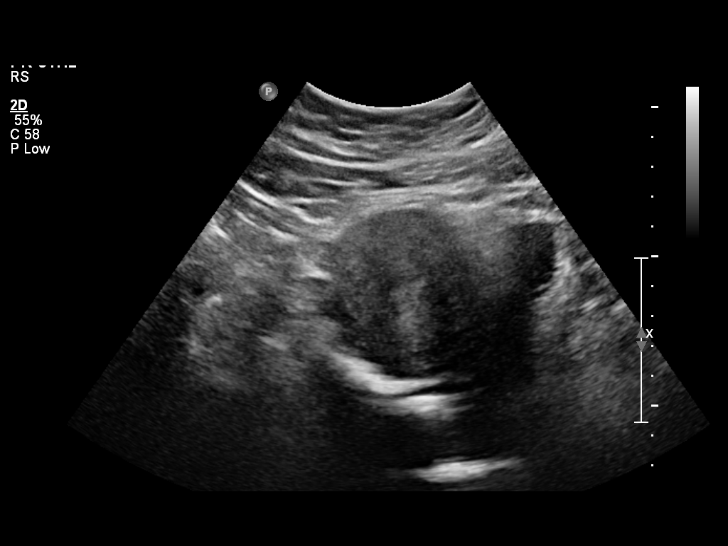
[im 9/32]
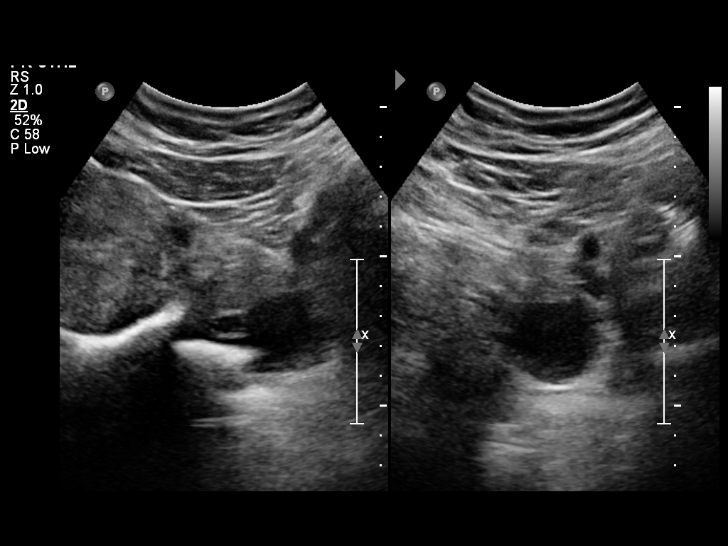
[im 11/32]
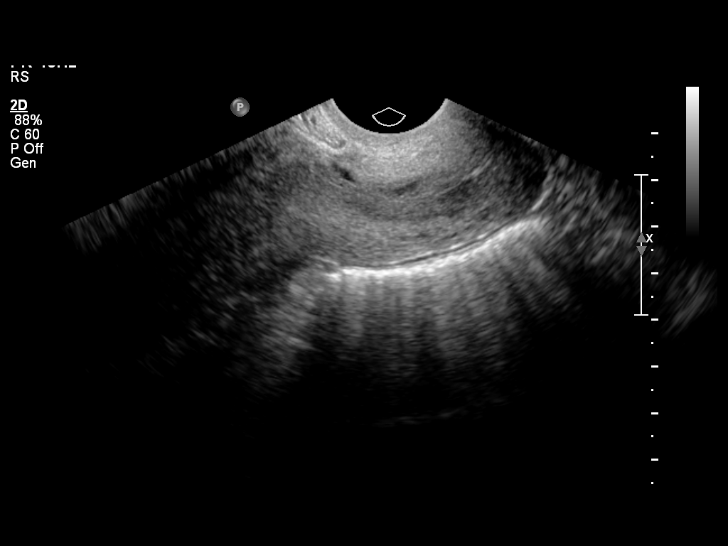
[im 13/32]
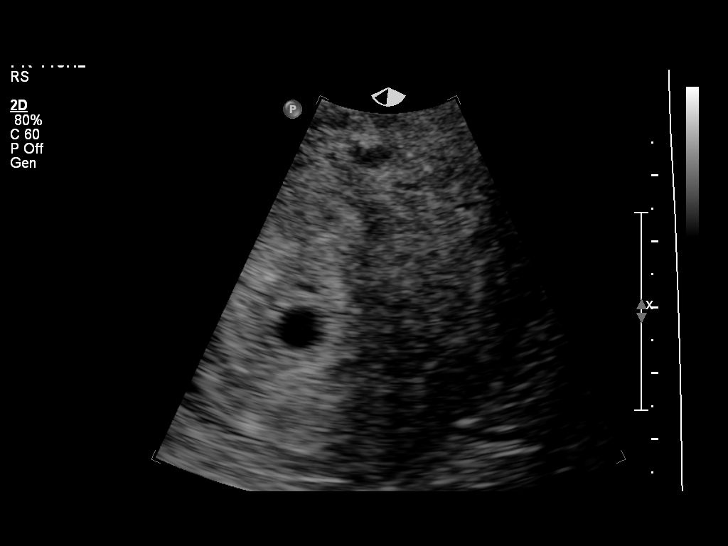
[im 17/32]
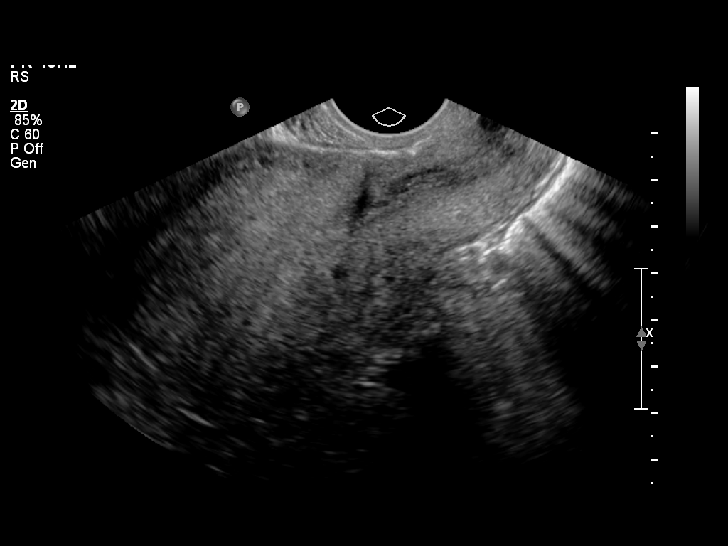
[im 19/32]
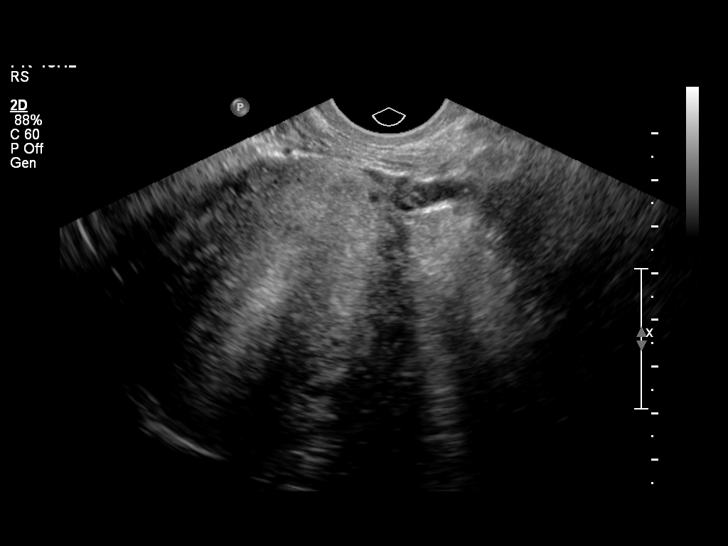
[im 21/32]
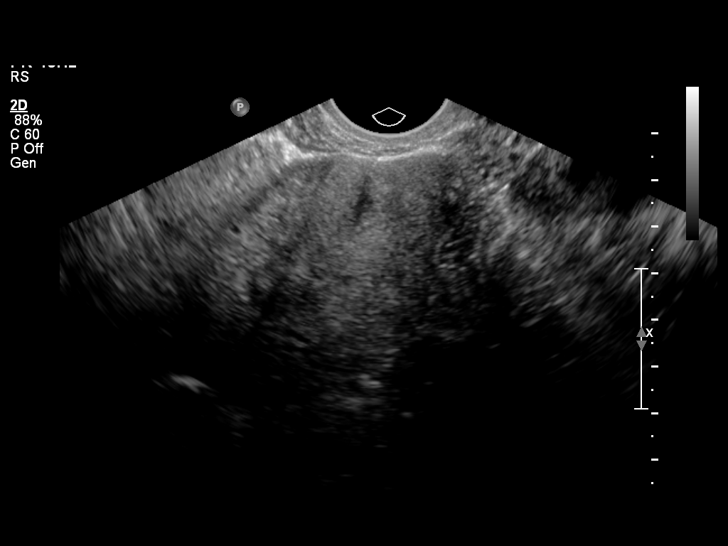
[im 23/32]
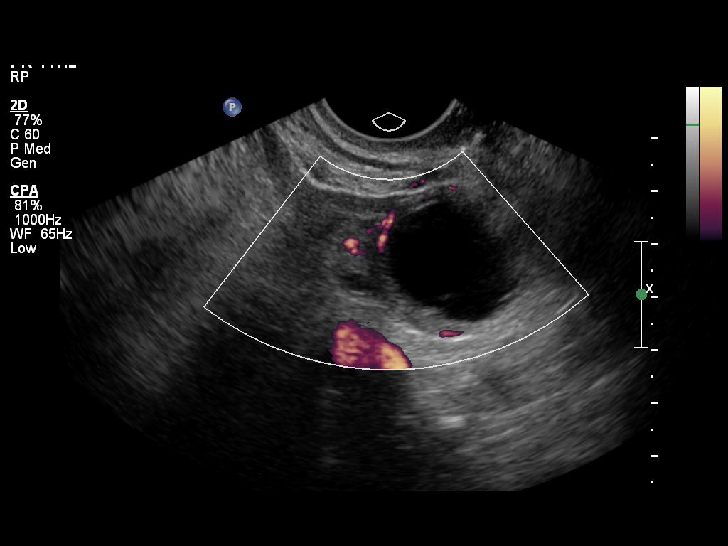
[im 26/32]
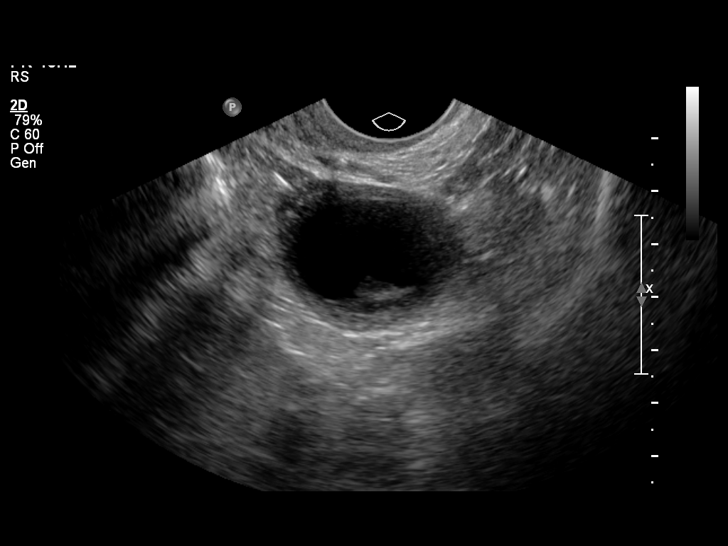
[im 28/32]
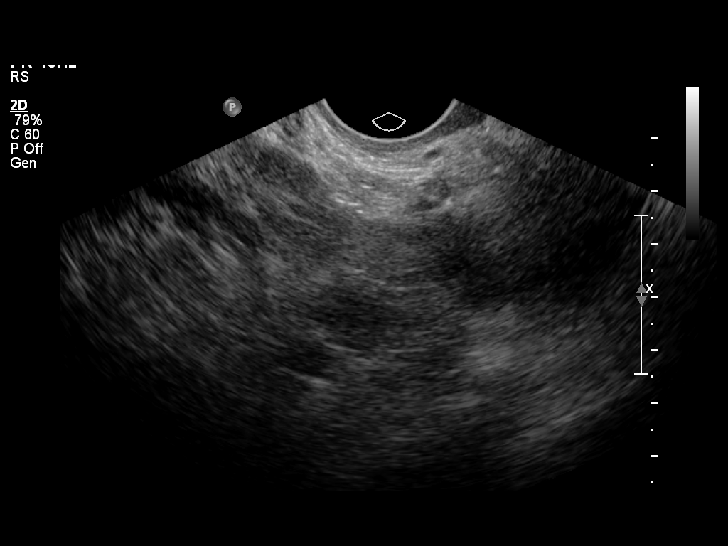
[im 30/32]
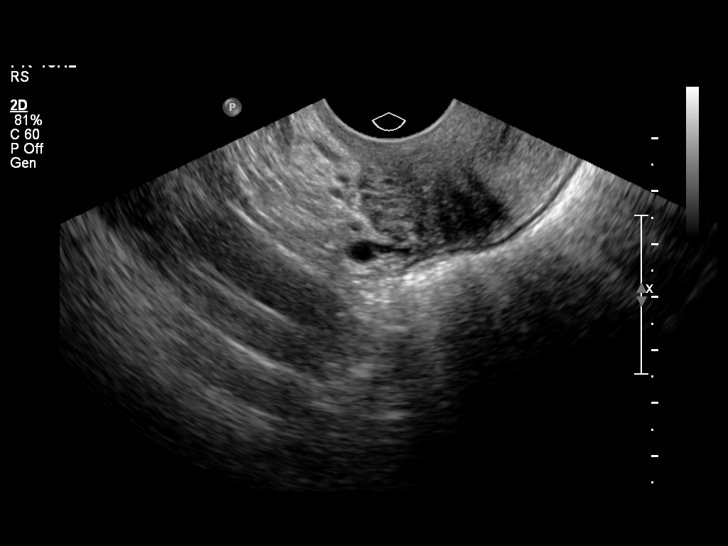

[13 of 28 positions shown; findings below may reference images not displayed]

FINDINGS: Intrauterine gestational sac: Present

Yolk sac:  Not seen

Embryo:  Not seen

Cardiac Activity: Not seen

MSD:  7.3  mm   5 w   2  d

Maternal uterus/adnexae: No subchorionic hemorrhage identified. The
right ovary has a normal appearance. Probable left corpus luteum
cyst is present.
IMPRESSION: 1. Probable early intrauterine gestational sac, but no yolk sac,
fetal pole, or cardiac activity yet visualized. Recommend follow-up
quantitative B-HCG levels and follow-up US in 14 days to confirm and
assess viability. This recommendation follows SRU consensus
guidelines: Diagnostic Criteria for Nonviable Pregnancy Early in the
First Trimester. N Engl J Med 9840; [DATE].
2. Normal appearance of the ovaries.

## 2015-07-02 ENCOUNTER — Ambulatory Visit (INDEPENDENT_AMBULATORY_CARE_PROVIDER_SITE_OTHER): Payer: Medicare Other | Admitting: Internal Medicine

## 2015-07-02 ENCOUNTER — Encounter: Payer: Self-pay | Admitting: Internal Medicine

## 2015-07-02 VITALS — Temp 98.5°F | Wt 295.0 lb

## 2015-07-02 DIAGNOSIS — B349 Viral infection, unspecified: Secondary | ICD-10-CM

## 2015-07-02 MED ORDER — IBUPROFEN 800 MG PO TABS: 800.0000 mg | ORAL_TABLET | Freq: Three times a day (TID) | ORAL | Status: DC

## 2015-07-02 NOTE — Patient Instructions (Addendum)
It was nice to meet you! Thank you for coming into clinic today.  You have a viral infection of your throat. Your sore throat should completely go away in 10-14 days. I have prescribed you a higher dose of ibuprofen to help with the pain. You can also try throat lozenges and hot tea to help soothe your throat.  If your sore throat has not gone away in 2 weeks, please come back to see [place]. If you experience fevers higher than 101.5 F, worsening sore throat, or difficulty breathing, please seek medical attention immediately.  -Dr. Brett Albino

## 2015-07-02 NOTE — Assessment & Plan Note (Signed)
Pt having sore throat and mild cough for the last couple of days. She denies fevers, chills, or feeling sick. Likely caused by a viral illness. -Will order Ibuprofen 800mg  TID for throat pain. -Advised Pt to use lozenges as needed -Pt to follow-up if she is still having a sore throat after 2 weeks

## 2015-07-02 NOTE — Progress Notes (Signed)
   Subjective:    Patient ID: Brandy Foster, female    DOB: [birth year], 39 y.o.   MRN: [id number]  HPI Ms. Heymann presents with sore throat for 2-3 days. She also notes that she has difficulty swallowing. Her left ear also started hurting 2 days ago. She has mild cough worse at night and occasional headaches. She tried taking Alka-seltzer, Tylenol, and a chloroseptic throat spray. None of these helped. She denies any fevers, chills, runny nose, or feeling sick. She denies any sick contacts or recent travel   Review of Systems  All other systems reviewed and are negative.      Objective:   Physical Exam  Constitutional: She appears well-developed and well-nourished. No distress.  Eyes: Conjunctivae are normal. Pupils are equal, round, and reactive to light.  Neck: Normal range of motion. Neck supple.  Tonsillar enlargement  Cardiovascular: Normal rate and regular rhythm.  Exam reveals no gallop.   No murmur heard. Pulmonary/Chest: Effort normal and breath sounds normal. No respiratory distress. She has no wheezes. She has no rales.  Lymphadenopathy:    She has no cervical adenopathy.  Skin: Skin is warm and dry. No rash noted.          Assessment & Plan:

## 2015-11-27 ENCOUNTER — Other Ambulatory Visit (HOSPITAL_COMMUNITY)
Admission: RE | Admit: 2015-11-27 | Discharge: 2015-11-27 | Disposition: A | Discharge status: Home / Self Care | Payer: Medicare Other | Source: Ambulatory Visit | Attending: Family Medicine | Admitting: Family Medicine

## 2015-11-27 ENCOUNTER — Encounter: Payer: Self-pay | Admitting: Family Medicine

## 2015-11-27 ENCOUNTER — Ambulatory Visit (INDEPENDENT_AMBULATORY_CARE_PROVIDER_SITE_OTHER): Payer: Medicare Other | Admitting: Family Medicine

## 2015-11-27 VITALS — BP 133/73 | HR 78 | Temp 98.3°F | Ht 68.0 in | Wt 290.0 lb

## 2015-11-27 DIAGNOSIS — Z113 Encounter for screening for infections with a predominantly sexual mode of transmission: Secondary | ICD-10-CM

## 2015-11-27 DIAGNOSIS — Z7251 High risk heterosexual behavior: Secondary | ICD-10-CM | POA: Diagnosis not present

## 2015-11-27 DIAGNOSIS — R309 Painful micturition, unspecified: Secondary | ICD-10-CM | POA: Diagnosis not present

## 2015-11-27 DIAGNOSIS — N760 Acute vaginitis: Secondary | ICD-10-CM

## 2015-11-27 DIAGNOSIS — B9689 Other specified bacterial agents as the cause of diseases classified elsewhere: Secondary | ICD-10-CM

## 2015-11-27 DIAGNOSIS — A499 Bacterial infection, unspecified: Secondary | ICD-10-CM

## 2015-11-27 LAB — POCT URINE PREGNANCY: Preg Test, Ur: NEGATIVE

## 2015-11-27 LAB — POCT URINALYSIS DIPSTICK
Bilirubin, UA: NEGATIVE
Glucose, UA: NEGATIVE
Ketones, UA: NEGATIVE
Nitrite, UA: NEGATIVE
Protein, UA: 30
Spec Grav, UA: >=1.03
Urobilinogen, UA: 0.2
pH, UA: 6

## 2015-11-27 LAB — POCT WET PREP (WET MOUNT): Clue Cells Wet Prep Whiff POC: NEGATIVE

## 2015-11-27 LAB — POCT UA - MICROSCOPIC ONLY: Epithelial cells, urine per micros: >20

## 2015-11-27 MED ORDER — METRONIDAZOLE 500 MG PO TABS: 500.0000 mg | ORAL_TABLET | Freq: Two times a day (BID) | ORAL | Status: DC

## 2015-11-27 NOTE — Patient Instructions (Signed)
I will call you with the results of your tests Please come back and give a "dirty" urine sample for use to do the other testing. If you do not hear from me or the office, please call the office.

## 2015-11-27 NOTE — Progress Notes (Signed)
Patient ID: Brandy Foster, female   DOB: 1976/04/10, 39 y.o.   MRN: [id number]    Subjective: CC: vaginal discharge HPI: Patient is a 39 y.o. female  presenting to clinic today for a SDA for vaginal discharge and concerns for STD.  VAGINAL DISCHARGE  Having vaginal discharge for 4-5 days. Medications tried: none Discharge consistency: thick/cottage cheese Discharge color: yellow, once grey Recent antibiotic use: no  Sex in last month: yes, unprotected sex multiple times with the same partner 2 weeks ago spurring her concerns for STDs.   Symptoms Fever: No Dysuria: No, but notes some burning with itching Vaginal bleeding: just noted for U/A Abdomen or Pelvic pain: no  Back pain: lower back pain that is stable Genital sores or ulcers: no   Rash: no  Pain during sex: haven't had it Missed menstrual period: No, LMP 11/14, actually noted some vaginal bleeding this AM.   Patient would really like to avoid a pelvic exam, notes significant pain and discomfort with these in the past.    Phone number for results: [phone]  Social History: never smoker  Health Maintenance: UTD  ROS: All other systems reviewed and are negative.  Past Medical History Patient Active Problem List   Diagnosis Date Noted  . Bacterial vaginosis 11/27/2015  . Screen for STD (sexually transmitted disease) 11/27/2015  . Viral illness 07/02/2015  . History of anemia 05/07/2015  . Severe obesity (BMI >= 40) ([place]) 06/12/2014  . Lightheadedness 06/12/2014  . Contraception 06/19/2011  . Healthcare maintenance 06/19/2011  . Chronic mental illness 06/19/2011  . Anemia 06/15/2011  . GLAUCOMA NEC 09/08/2007    Medications- reviewed and updated Current Outpatient Prescriptions  Medication Sig Dispense Refill  . ferrous sulfate 325 (65 FE) MG tablet Take 325 mg by mouth daily.    Marland Kitchen ibuprofen (ADVIL,MOTRIN) 800 MG tablet Take 1 tablet (800 mg total) by mouth 3 (three) times daily. 30 tablet 0  .  metroNIDAZOLE (FLAGYL) 500 MG tablet Take 1 tablet (500 mg total) by mouth 2 (two) times daily. 14 tablet 0   No current facility-administered medications for this visit.    Objective: Office vital signs reviewed. BP 133/73 mmHg  Pulse 78  Temp(Src) 98.3 F (36.8 C) (Oral)  Ht 5\' 8"  (1.727 m)  Wt 290 lb (131.543 kg)  BMI 44.10 kg/m2  LMP 11/04/2015 (Exact Date)   Physical Examination:  General: Awake, alert, well- nourished, NAD GI: Obese soft, NT/ND,+BS x4, no hepatomegaly, no splenomegaly GU: deferred per patient preference. Skin: dry, intact, no rashes or lesions  Wet prep: few clue cells  Assessment/Plan: Bacterial vaginosis Patient vaginal discharge and wet prep consistent with bacterial vaginosis by self wet prep - Flagyl 500mg  BID x 7 days, discussed disulfiram reaction - if symptoms do not resolve, will needed pelvic exam, patient voices understanding.   Screen for STD (sexually transmitted disease) Discussed the importance of safe sex, patient voices understanding. - GC/Chlamydia urine amplification today. - HIV possibly not covered per Medicare, discussed with patient that Medicaid would most likely cover what Medicare did not but I could not guarantee she would not get billed for the HIV testing. Patient has been to health dept in the past, defers to get HIV and RPR testing there where she knows that it will definitely be free.     Orders Placed This Encounter  Procedures  . GC/chlamydia probe amp, urine  . POCT urinalysis dipstick  . POCT UA - Microscopic Only  . POCT urine pregnancy  .  POCT Wet Prep [facility])    Meds ordered this encounter  Medications  . metroNIDAZOLE (FLAGYL) 500 MG tablet    Sig: Take 1 tablet (500 mg total) by mouth 2 (two) times daily.    Dispense:  14 tablet    Refill:  [place] PGY-2, [place]

## 2015-11-27 NOTE — Addendum Note (Signed)
Addended by: Maryland Pink on: 11/27/2015 03:35 PM   Modules accepted: Orders

## 2015-11-27 NOTE — Assessment & Plan Note (Signed)
Patient vaginal discharge and wet prep consistent with bacterial vaginosis by self wet prep - Flagyl 500mg  BID x 7 days, discussed disulfiram reaction - if symptoms do not resolve, will needed pelvic exam, patient voices understanding.

## 2015-11-27 NOTE — Assessment & Plan Note (Signed)
Discussed the importance of safe sex, patient voices understanding. - GC/Chlamydia urine amplification today. - HIV possibly not covered per Medicare, discussed with patient that Medicaid would most likely cover what Medicare did not but I could not guarantee she would not get billed for the HIV testing. Patient has been to health dept in the past, defers to get HIV and RPR testing there where she knows that it will definitely be free.

## 2015-11-28 ENCOUNTER — Telehealth: Payer: Self-pay | Admitting: Family Medicine

## 2015-11-28 LAB — URINE CYTOLOGY ANCILLARY ONLY
Chlamydia: NEGATIVE
Neisseria Gonorrhea: NEGATIVE

## 2015-11-28 NOTE — Telephone Encounter (Signed)
Please contact Brandy Foster and let her know that both the gonorrhea and chlamydia tests are negative.  Thank you, [name], MD [facility] Resident  11/28/2015, 5:44 PM

## 2015-11-29 NOTE — Telephone Encounter (Signed)
Patient informed. 

## 2016-06-26 ENCOUNTER — Inpatient Hospital Stay (HOSPITAL_COMMUNITY)
Admission: AD | Admit: 2016-06-26 | Discharge: 2016-06-26 | Disposition: A | Discharge status: Home / Self Care | Payer: Medicare Other | Source: Ambulatory Visit | Attending: Obstetrics & Gynecology | Admitting: Obstetrics & Gynecology

## 2016-06-26 ENCOUNTER — Encounter (HOSPITAL_COMMUNITY): Payer: Self-pay | Admitting: *Deleted

## 2016-06-26 DIAGNOSIS — F419 Anxiety disorder, unspecified: Secondary | ICD-10-CM | POA: Insufficient documentation

## 2016-06-26 DIAGNOSIS — Z3202 Encounter for pregnancy test, result negative: Secondary | ICD-10-CM | POA: Insufficient documentation

## 2016-06-26 DIAGNOSIS — R7881 Bacteremia: Secondary | ICD-10-CM | POA: Insufficient documentation

## 2016-06-26 DIAGNOSIS — F329 Major depressive disorder, single episode, unspecified: Secondary | ICD-10-CM | POA: Diagnosis not present

## 2016-06-26 DIAGNOSIS — R109 Unspecified abdominal pain: Secondary | ICD-10-CM | POA: Diagnosis not present

## 2016-06-26 LAB — URINE MICROSCOPIC-ADD ON: WBC, UA: NONE SEEN WBC/hpf (ref 0–5)

## 2016-06-26 LAB — POCT PREGNANCY, URINE: Preg Test, Ur: NEGATIVE

## 2016-06-26 LAB — URINALYSIS, ROUTINE W REFLEX MICROSCOPIC
Bilirubin Urine: NEGATIVE
Glucose, UA: NEGATIVE mg/dL
Ketones, ur: NEGATIVE mg/dL
Leukocytes, UA: NEGATIVE
Nitrite: NEGATIVE
Protein, ur: NEGATIVE mg/dL
Specific Gravity, Urine: >1.03 — ABNORMAL HIGH (ref 1.005–1.030)
pH: 6 (ref 5.0–8.0)

## 2016-06-26 LAB — WET PREP, GENITAL
Clue Cells Wet Prep HPF POC: NONE SEEN
Sperm: NONE SEEN
Trich, Wet Prep: NONE SEEN
WBC, Wet Prep HPF POC: NONE SEEN
Yeast Wet Prep HPF POC: NONE SEEN

## 2016-06-26 MED ORDER — SULFAMETHOXAZOLE-TRIMETHOPRIM 800-160 MG PO TABS: 1.0000 | ORAL_TABLET | Freq: Two times a day (BID) | ORAL | Status: AC

## 2016-06-26 NOTE — MAU Provider Note (Signed)
History     CSN: [id number]  Arrival date and time: 06/26/16 1628   First Provider Initiated Contact with Patient 06/26/16 1917      Chief Complaint  Patient presents with  . Abdominal Cramping   Patient is a 40 y.o. female presenting with cramps.  Abdominal Cramping The primary symptoms of the illness include abdominal pain. The primary symptoms of the illness do not include fever, nausea, vomiting or dysuria.  Symptoms associated with the illness do not include chills, urgency, hematuria or frequency.     DEVAYA VELTRI is a 40 year old [id number] non-pregnant female with history of STIs and BV presenting with 1 week history of intermittent abdominal cramping and stomach pain. Pain is 7/10 at it's worse but currently 3/10. She said the pain felt like her normal premenstrual cramps but her period never came. She's had two negative home pregnancy tests. Also endorses white vaginal discharge for 2-3 days last week which has now returned to clear discharge. LMP was 5/26. UPT here was negative.   Denies n/v, h/a, fevers, dysuria, hematuria, urinary frequency, vaginal bleeding/irritation/burning.    OB History    Gravida Para Term Preterm AB TAB SAB Ectopic Multiple Living   6 2 2  3 2 1   2       Past Medical History  Diagnosis Date  . Anemia   . Depression   . Anxiety     Past Surgical History  Procedure Laterality Date  . Cesarean section      Family History  Problem Relation Age of Onset  . Diabetes Mother   . COPD Mother   . Arthritis Mother   . Hypertension Mother   . Hyperlipidemia Father   . Schizophrenia Brother     Social History  Substance Use Topics  . Smoking status: Never Smoker   . Smokeless tobacco: Never Used  . Alcohol Use: No    Allergies: No Known Allergies  Prescriptions prior to admission  Medication Sig Dispense Refill Last Dose  . ferrous sulfate 325 (65 FE) MG tablet Take 325 mg by mouth daily.   06/25/2016 at Unknown time  . LATANOPROST  OP Place 1 drop into both eyes at bedtime.   Past Week at Unknown time  . ibuprofen (ADVIL,MOTRIN) 800 MG tablet Take 1 tablet (800 mg total) by mouth 3 (three) times daily. (Patient not taking: Reported on 06/26/2016) 30 tablet 0   . metroNIDAZOLE (FLAGYL) 500 MG tablet Take 1 tablet (500 mg total) by mouth 2 (two) times daily. (Patient not taking: Reported on 06/26/2016) 14 tablet 0    Results for orders placed or performed during the hospital encounter of 06/26/16 (from the past 48 hour(s))  Urinalysis, Routine w reflex microscopic (not at [facility])     Status: Abnormal   Collection Time: 06/26/16  5:00 PM  Result Value Ref Range   Color, Urine YELLOW YELLOW   APPearance CLEAR CLEAR   Specific Gravity, Urine >1.030 (H) 1.005 - 1.030   pH 6.0 5.0 - 8.0   Glucose, UA NEGATIVE NEGATIVE mg/dL   Hgb urine dipstick MODERATE (A) NEGATIVE   Bilirubin Urine NEGATIVE NEGATIVE   Ketones, ur NEGATIVE NEGATIVE mg/dL   Protein, ur NEGATIVE NEGATIVE mg/dL   Nitrite NEGATIVE NEGATIVE   Leukocytes, UA NEGATIVE NEGATIVE  Urine microscopic-add on     Status: Abnormal   Collection Time: 06/26/16  5:00 PM  Result Value Ref Range   Squamous Epithelial / LPF 6-30 (A) NONE SEEN  WBC, UA NONE SEEN 0 - 5 WBC/hpf   RBC / HPF 0-5 0 - 5 RBC/hpf   Bacteria, UA MANY (A) NONE SEEN   Urine-Other MUCOUS PRESENT   Pregnancy, urine POC     Status: None   Collection Time: 06/26/16  5:06 PM  Result Value Ref Range   Preg Test, Ur NEGATIVE NEGATIVE    Comment:        THE SENSITIVITY OF THIS METHODOLOGY IS >24 mIU/mL   Wet prep, genital     Status: None   Collection Time: 06/26/16  7:23 PM  Result Value Ref Range   Yeast Wet Prep HPF POC NONE SEEN NONE SEEN   Trich, Wet Prep NONE SEEN NONE SEEN   Clue Cells Wet Prep HPF POC NONE SEEN NONE SEEN   WBC, Wet Prep HPF POC NONE SEEN NONE SEEN    Comment: MANY BACTERIA SEEN   Sperm NONE SEEN     Review of Systems  Constitutional: Negative for fever and chills.   Gastrointestinal: Positive for abdominal pain. Negative for nausea and vomiting.  Genitourinary: Negative for dysuria, urgency, frequency and hematuria.  Neurological: Negative for headaches.    Physical Exam   Blood pressure 115/76, pulse 103, temperature 97.9 F (36.6 C), temperature source Oral, resp. rate 18, height 5\' 6"  (1.676 m), weight 284 lb 9.6 oz (129.094 kg), last menstrual period 05/15/2016.  Physical Exam  Constitutional: She is oriented to person, place, and time. She appears well-developed and well-nourished.  Cardiovascular: Normal rate and regular rhythm.   Respiratory: Effort normal and breath sounds normal.  GI: Soft. Bowel sounds are normal. There is tenderness in the suprapubic area. There is no rebound and no guarding.  Neurological: She is alert and oriented to person, place, and time.  Psychiatric: She has a normal mood and affect.    MAU Course  Procedures  None  MDM  Patient declines speculum exam today, says she plans to have pap done soon and will have speculum done at that time.  Wet prep and Gc collected by [employer].   Assessment and Plan    A:  1. Encounter for pregnancy test with result negative   2. Abdominal cramping   3. Bacteremia     P:  Discharge home in stable condition Rx: Bactrim Discussed at home pregnancy tests Return to MAU for emergencies only.    Lezlie Lye, NP 06/28/2016 9:54 AM

## 2016-06-26 NOTE — MAU Note (Signed)
Been having stomach cramping. Period is over a wk late, did 2 preg tests and they were both neg. At first was having a clear d/c then it turned white.

## 2016-06-26 NOTE — Discharge Instructions (Signed)
Pregnancy Test Information °WHAT IS A PREGNANCY TEST? °A pregnancy test is used to detect the presence of human chorionic gonadotropin (hCG) in a sample of your urine or blood. hCG is a hormone produced by the cells of the placenta. The placenta is the organ that forms to nourish and support a developing baby. °This test requires a sample of either blood or urine. A pregnancy test determines whether you are pregnant or not. °HOW ARE PREGNANCY TESTS DONE? °Pregnancy tests are done using a home pregnancy test or having a blood or urine test done at your health care provider's office.  °Home pregnancy tests require a urine sample. °· Most kits use a plastic testing device with a strip of paper that indicates whether there is hCG in your urine. °· Follow the test instructions very carefully. °· After you urinate on the test stick, markings will appear to let you know whether you are pregnant. °· For best results, use your first urine of the morning. That is when the concentration of hCG is highest. °Having a blood test to check for pregnancy requires a sample of blood drawn from a vein in your hand or arm. Your health care provider will send your sample to a lab for testing. Results of a pregnancy test will be positive or negative. °IS ONE TYPE OF PREGNANCY TEST BETTER THAN ANOTHER? °In some cases, a blood test will return a positive result even if a urine test was negative because blood tests are more sensitive. This means blood tests can detect hCG earlier than home pregnancy tests.  °HOW ACCURATE ARE HOME PREGNANCY TESTS?  °Both types of pregnancy tests are very accurate. °· A blood test is about 98% accurate. °· When you are far enough along in your pregnancy and when used correctly, home pregnancy tests are equally accurate. °CAN ANYTHING INTERFERE WITH HOME PREGNANCY TEST RESULTS?  °It is possible for certain conditions to cause an inaccurate test result (false positive or false negative). °· A false positive is a  positive test result when you are not pregnant. This can happen if you: °¨ Are taking certain medicines, including anticonvulsants or tranquilizers. °¨ Have certain proteins in your blood. °· A false negative is a negative test result when you are pregnant. This can happen if you: °¨ Took the test before there was enough hCG to detect. A pregnancy test will not be positive in most women until 3-4 weeks after conception. °¨ Drank a lot of liquid before the test. Diluted urine samples can sometimes give an inaccurate result. °¨ Take certain medicines, such as water pills (diuretics) or some antihistamines. °WHAT SHOULD I DO IF I HAVE A POSITIVE PREGNANCY TEST? °If you have a positive pregnancy test, schedule an appointment with your health care provider. You might need additional testing to confirm the pregnancy. In the meantime, begin taking a prenatal vitamin, stop smoking, stop drinking alcohol, and do not use street drugs. °Talk to your health care provider about how to take care of yourself during your pregnancy. Ask about what to expect from the care you will need throughout pregnancy (prenatal care). °  °This information is not intended to replace advice given to you by your health care provider. Make sure you discuss any questions you have with your health care provider. °  °Document Released: 12/10/2003 Document Revised: 12/28/2014 Document Reviewed: 04/03/2014 °Elsevier Interactive Patient Education ©2016 Elsevier Inc. ° °

## 2016-06-29 LAB — GC/CHLAMYDIA PROBE AMP (~~LOC~~) NOT AT ARMC
Chlamydia: NEGATIVE
Neisseria Gonorrhea: NEGATIVE

## 2016-08-12 ENCOUNTER — Ambulatory Visit: Payer: Medicare Other | Admitting: Family Medicine

## 2016-08-19 ENCOUNTER — Ambulatory Visit: Payer: Medicare Other | Admitting: Family Medicine

## 2016-08-24 ENCOUNTER — Emergency Department (HOSPITAL_COMMUNITY)
Admission: EM | Admit: 2016-08-24 | Discharge: 2016-08-24 | Disposition: A | Discharge status: Home / Self Care | Payer: Medicare Other | Attending: Emergency Medicine | Admitting: Emergency Medicine

## 2016-08-24 ENCOUNTER — Emergency Department (HOSPITAL_COMMUNITY): Payer: Medicare Other

## 2016-08-24 ENCOUNTER — Encounter (HOSPITAL_COMMUNITY): Payer: Self-pay | Admitting: Emergency Medicine

## 2016-08-24 DIAGNOSIS — M67431 Ganglion, right wrist: Secondary | ICD-10-CM

## 2016-08-24 DIAGNOSIS — Y9389 Activity, other specified: Secondary | ICD-10-CM | POA: Insufficient documentation

## 2016-08-24 DIAGNOSIS — Y9289 Other specified places as the place of occurrence of the external cause: Secondary | ICD-10-CM | POA: Insufficient documentation

## 2016-08-24 DIAGNOSIS — Y999 Unspecified external cause status: Secondary | ICD-10-CM | POA: Diagnosis not present

## 2016-08-24 DIAGNOSIS — M25531 Pain in right wrist: Secondary | ICD-10-CM | POA: Diagnosis not present

## 2016-08-24 DIAGNOSIS — W19XXXA Unspecified fall, initial encounter: Secondary | ICD-10-CM | POA: Diagnosis not present

## 2016-08-24 DIAGNOSIS — S6991XA Unspecified injury of right wrist, hand and finger(s), initial encounter: Secondary | ICD-10-CM | POA: Diagnosis not present

## 2016-08-24 HISTORY — DX: Obesity, unspecified: E66.9

## 2016-08-24 MED ORDER — NAPROXEN 500 MG PO TABS: 500.0000 mg | ORAL_TABLET | Freq: Two times a day (BID) | ORAL | 0 refills | Status: AC

## 2016-08-24 NOTE — ED Notes (Signed)
Pt to xray

## 2016-08-24 NOTE — ED Triage Notes (Signed)
Pt. reported that she fell 2 weeks ago reports pain at right wrist with mild swelling . Pt. added occasional nausea.

## 2016-08-24 NOTE — ED Provider Notes (Signed)
[facility] DEPT Provider Note   CSN: [id number] Arrival date & time: 08/24/16  2026   By signing my name below, I, Brandy Foster, attest that this documentation has been prepared under the direction and in the presence of [place]. Janit Bern, NP. Electronically Signed: Estanislado Foster, Scribe. 08/24/2016. 12:25 AM.   History   Chief Complaint Chief Complaint  Patient presents with  . Wrist Pain    The history is provided by the patient. No language interpreter was used.  Wrist Pain    HPI Comments:  Brandy Foster is a 40 y.o. female who presents to the Emergency Department complaining of constant R wrist pain sustained after a fall x2 weeks. Pt reports that she was at [facility], fell down, and hurt her R wrist attempting to brace the fall. Pt states that the pain occasionally radiates down her forearm. Pt also states that she has been having diarrhea. Pt denies numbness. She has an appointment scheduled with her PCP in 2 days.   Past Medical History:  Diagnosis Date  . Anemia   . Anxiety   . Depression   . Obesity     Patient Active Problem List   Diagnosis Date Noted  . Bacterial vaginosis 11/27/2015  . Screen for STD (sexually transmitted disease) 11/27/2015  . Viral illness 07/02/2015  . History of anemia 05/07/2015  . Severe obesity (BMI >= 40) ([place]) 06/12/2014  . Lightheadedness 06/12/2014  . Contraception 06/19/2011  . Healthcare maintenance 06/19/2011  . Chronic mental illness 06/19/2011  . Anemia 06/15/2011  . GLAUCOMA NEC 09/08/2007    Past Surgical History:  Procedure Laterality Date  . CESAREAN SECTION      OB History    Gravida Para Term Preterm AB Living   6 2 2   3 2    SAB TAB Ectopic Multiple Live Births   1 2             Home Medications    Prior to Admission medications   Medication Sig Start Date End Date Taking? Authorizing Provider  ferrous sulfate 325 (65 FE) MG tablet Take 325 mg by mouth daily.    Historical Provider, MD    ibuprofen (ADVIL,MOTRIN) 800 MG tablet Take 1 tablet (800 mg total) by mouth 3 (three) times daily. Patient not taking: Reported on 06/26/2016 07/02/15   Sela Hua, MD  LATANOPROST OP Place 1 drop into both eyes at bedtime.    Historical Provider, MD  metroNIDAZOLE (FLAGYL) 500 MG tablet Take 1 tablet (500 mg total) by mouth 2 (two) times daily. Patient not taking: Reported on 06/26/2016 11/27/15   [name], MD  naproxen (NAPROSYN) 500 MG tablet Take 1 tablet (500 mg total) by mouth 2 (two) times daily. 08/24/16   Dacari Beckstrand Bunnie Pion, NP    Family History Family History  Problem Relation Age of Onset  . Diabetes Mother   . COPD Mother   . Arthritis Mother   . Hypertension Mother   . Hyperlipidemia Father   . Schizophrenia Brother     Social History Social History  Substance Use Topics  . Smoking status: Never Smoker  . Smokeless tobacco: Never Used  . Alcohol use No     Allergies   Review of patient's allergies indicates no known allergies.   Review of Systems Review of Systems  Gastrointestinal: Positive for diarrhea.  Musculoskeletal: Positive for arthralgias.       Right wrist pain  Neurological: Negative for numbness.  All other  systems reviewed and are negative.    Physical Exam Updated Vital Signs BP 130/75 (BP Location: Left Arm)   Pulse 98   Temp 97.7 F (36.5 C) (Oral)   Resp 18   LMP 08/17/2016 (Approximate)   SpO2 100%   Physical Exam  Constitutional: She appears well-developed and well-nourished. No distress.  HENT:  Head: Normocephalic and atraumatic.  Eyes: Conjunctivae are normal.  Cardiovascular: Normal rate.   Pulmonary/Chest: Effort normal.  Abdominal: She exhibits no distension. There is no tenderness.  Musculoskeletal:       Right wrist: She exhibits tenderness. She exhibits normal range of motion, no crepitus and no laceration.  Raised tender cystic area to the dorsum of the right wrist.   Neurological: She is alert.  Skin: Skin is  warm and dry.  Raised tender area to dorsum of R wrist consistent with ganglion cyst.  Psychiatric: She has a normal mood and affect. Her behavior is normal.  Nursing note and vitals reviewed.    ED Treatments / Results  DIAGNOSTIC STUDIES:  Oxygen Saturation is 100% on RA, normal by my interpretation.    COORDINATION OF CARE:  12:25 AM Discussed treatment plan with pt at bedside and pt agreed to plan.  Labs (all labs ordered are listed, but only abnormal results are displayed) Labs Reviewed - No data to display  Radiology Dg Wrist Complete Right  Result Date: 08/24/2016 CLINICAL DATA:  Status post fall 1 month ago with a right wrist injury. Continued pain. Initial encounter. EXAM: RIGHT WRIST - COMPLETE 3+ VIEW COMPARISON:  None. FINDINGS: There is no evidence of fracture or dislocation. There is no evidence of arthropathy or other focal bone abnormality. Soft tissues are unremarkable. IMPRESSION: Negative exam. Electronically Signed   By: Inge Rise M.D.   On: 08/24/2016 21:13    Procedures Procedures (including critical care time)  Medications Ordered in ED Medications - No data to display   Initial Impression / Assessment and Plan / ED Course  I have reviewed the triage vital signs and the nursing notes.  Pertinent labs & imaging results that were available during my care of the patient were reviewed by me and considered in my medical decision making (see chart for details).  Clinical Course    Final Clinical Impressions(s) / ED Diagnoses  40 y.o. female with right wrist pain stable for d/c without focal neuro deficits. Wrist splint and f/u with ortho.   Discussed diet for diarrhea until patient can see her PCP in 48 hours.   Final diagnoses:  Ganglion cyst of wrist, right    New Prescriptions Discharge Medication List as of 08/24/2016  9:36 PM    START taking these medications   Details  naproxen (NAPROSYN) 500 MG tablet Take 1 tablet (500 mg total) by  mouth 2 (two) times daily., Starting Mon 08/24/2016, Print      I personally performed the services described in this documentation, which was scribed in my presence. The recorded information has been reviewed and is accurate.     [address] [place], NP 08/25/16 0030    Margette Fast, MD 08/25/16 [phone]

## 2016-08-26 ENCOUNTER — Encounter: Payer: Medicare Other | Admitting: Family Medicine

## 2016-09-01 ENCOUNTER — Ambulatory Visit (INDEPENDENT_AMBULATORY_CARE_PROVIDER_SITE_OTHER): Payer: Medicare Other | Admitting: Family Medicine

## 2016-09-01 ENCOUNTER — Encounter: Payer: Self-pay | Admitting: Family Medicine

## 2016-09-01 VITALS — BP 110/79 | HR 97 | Temp 99.3°F | Ht 66.0 in | Wt 276.8 lb

## 2016-09-01 DIAGNOSIS — R5383 Other fatigue: Secondary | ICD-10-CM

## 2016-09-01 DIAGNOSIS — M67431 Ganglion, right wrist: Secondary | ICD-10-CM | POA: Diagnosis not present

## 2016-09-01 DIAGNOSIS — Z0000 Encounter for general adult medical examination without abnormal findings: Secondary | ICD-10-CM | POA: Diagnosis not present

## 2016-09-01 DIAGNOSIS — M67439 Ganglion, unspecified wrist: Secondary | ICD-10-CM | POA: Insufficient documentation

## 2016-09-01 DIAGNOSIS — R42 Dizziness and giddiness: Secondary | ICD-10-CM | POA: Diagnosis not present

## 2016-09-01 LAB — CBC
HCT: 32.6 % — ABNORMAL LOW (ref 35.0–45.0)
Hemoglobin: 9.9 g/dL — ABNORMAL LOW (ref 11.7–15.5)
MCH: 26.2 pg — ABNORMAL LOW (ref 27.0–33.0)
MCHC: 30.4 g/dL — ABNORMAL LOW (ref 32.0–36.0)
MCV: 86.2 fL (ref 80.0–100.0)
MPV: 9.8 fL (ref 7.5–12.5)
Platelets: 332 10*3/uL (ref 140–400)
RBC: 3.78 MIL/uL — ABNORMAL LOW (ref 3.80–5.10)
RDW: 15.9 % — ABNORMAL HIGH (ref 11.0–15.0)
WBC: 8.9 10*3/uL (ref 3.8–10.8)

## 2016-09-01 LAB — LIPID PANEL
Cholesterol: 183 mg/dL (ref 125–200)
HDL: 47 mg/dL (ref >=46)
LDL Cholesterol: 97 mg/dL (ref <130)
Total CHOL/HDL Ratio: 3.9 Ratio (ref <=5.0)
Triglycerides: 193 mg/dL — ABNORMAL HIGH (ref <150)
VLDL: 39 mg/dL — ABNORMAL HIGH (ref <30)

## 2016-09-01 LAB — BASIC METABOLIC PANEL WITH GFR
BUN: 8 mg/dL (ref 7–25)
CO2: 23 mmol/L (ref 20–31)
Calcium: 9.1 mg/dL (ref 8.6–10.2)
Chloride: 108 mmol/L (ref 98–110)
Creat: 0.83 mg/dL (ref 0.50–1.10)
GFR, Est African American: >89 mL/min (ref >=60)
GFR, Est Non African American: 88 mL/min (ref >=60)
Glucose, Bld: 108 mg/dL — ABNORMAL HIGH (ref 65–99)
Potassium: 4.3 mmol/L (ref 3.5–5.3)
Sodium: 140 mmol/L (ref 135–146)

## 2016-09-01 NOTE — Assessment & Plan Note (Signed)
No syncopal events. Likely orthostatic in nature as patient has tested positive for orthostatic hypotension the past. Likely an associated component of deconditioning as well. - Encourage adequate hydration. - Encouraged safe and controlled exercise.

## 2016-09-01 NOTE — Patient Instructions (Signed)
It was a pleasure seeing you today in our clinic. Today we discussed your health maintenance. Here is the treatment plan we have discussed and agreed upon together:   - I placed a referral to a hand surgeon. It may take a few weeks before they contact you for an appointment. - I would like for you to increase your overall activity level. Aiming of - Listed below are recommendations for a Mediterranean diet.     Why follow it? Research shows. . Those who follow the Mediterranean diet have a reduced risk of heart disease  . The diet is associated with a reduced incidence of Parkinson's and Alzheimer's diseases . People following the diet may have longer life expectancies and lower rates of chronic diseases  . The Dietary Guidelines for Americans recommends the Mediterranean diet as an eating plan to promote health and prevent disease  What Is the Mediterranean Diet?  . Healthy eating plan based on typical foods and recipes of Mediterranean-style cooking . The diet is primarily a plant based diet; these foods should make up a majority of meals   Starches - Plant based foods should make up a majority of meals - They are an important sources of vitamins, minerals, energy, antioxidants, and fiber - Choose whole grains, foods high in fiber and minimally processed items  - Typical grain sources include wheat, oats, barley, corn, brown rice, bulgar, farro, millet, polenta, couscous  - Various types of beans include chickpeas, lentils, fava beans, black beans, white beans   Fruits  Veggies - Large quantities of antioxidant rich fruits & veggies; 6 or more servings  - Vegetables can be eaten raw or lightly drizzled with oil and cooked  - Vegetables common to the traditional Mediterranean Diet include: artichokes, arugula, beets, broccoli, brussel sprouts, cabbage, carrots, celery, collard greens, cucumbers, eggplant, kale, leeks, lemons, lettuce, mushrooms, okra, onions, peas, peppers, potatoes,  pumpkin, radishes, rutabaga, shallots, spinach, sweet potatoes, turnips, zucchini - Fruits common to the Mediterranean Diet include: apples, apricots, avocados, cherries, clementines, dates, figs, grapefruits, grapes, melons, nectarines, oranges, peaches, pears, pomegranates, strawberries, tangerines  Fats - Replace butter and margarine with healthy oils, such as olive oil, canola oil, and tahini  - Limit nuts to no more than a handful a day  - Nuts include walnuts, almonds, pecans, pistachios, pine nuts  - Limit or avoid candied, honey roasted or heavily salted nuts - Olives are central to the [employer] - can be eaten whole or used in a variety of dishes   Meats Protein - Limiting red meat: no more than a few times a month - When eating red meat: choose lean cuts and keep the portion to the size of deck of cards - Eggs: approx. 0 to 4 times a week  - Fish and lean poultry: at least 2 a week  - Healthy protein sources include, chicken, [place], lean beef, lamb - Increase intake of seafood such as tuna, salmon, trout, mackerel, shrimp, scallops - Avoid or limit high fat processed meats such as sausage and bacon  Dairy - Include moderate amounts of low fat dairy products  - Focus on healthy dairy such as fat free yogurt, skim milk, low or reduced fat cheese - Limit dairy products higher in fat such as whole or 2% milk, cheese, ice cream  Alcohol - Moderate amounts of red wine is ok  - No more than 5 oz daily for women (all ages) and men older than age 44  - No more  than 10 oz of wine daily for men younger than 74  Other - Limit sweets and other desserts  - Use herbs and spices instead of salt to flavor foods  - Herbs and spices common to the traditional Mediterranean Diet include: basil, bay leaves, chives, cloves, cumin, fennel, garlic, lavender, marjoram, mint, oregano, parsley, pepper, rosemary, sage, savory, sumac, tarragon, thyme   It's not just a diet, it's a lifestyle:  . The  Mediterranean diet includes lifestyle factors typical of those in the region  . Foods, drinks and meals are best eaten with others and savored . Daily physical activity is important for overall good health . This could be strenuous exercise like running and aerobics . This could also be more leisurely activities such as walking, housework, yard-work, or taking the stairs . Moderation is the key; a balanced and healthy diet accommodates most foods and drinks . Consider portion sizes and frequency of consumption of certain foods   Meal Ideas & Options:  . Breakfast:  o Whole wheat toast or whole wheat English muffins with peanut butter & hard boiled egg o Steel cut oats topped with apples & cinnamon and skim milk  o Fresh fruit: banana, strawberries, melon, berries, peaches  o Smoothies: strawberries, bananas, greek yogurt, peanut butter o Low fat greek yogurt with blueberries and granola  o Egg white omelet with spinach and mushrooms o Breakfast couscous: whole wheat couscous, apricots, skim milk, cranberries  . Sandwiches:  o Hummus and grilled vegetables (peppers, zucchini, squash) on whole wheat bread   o Grilled chicken on whole wheat pita with lettuce, tomatoes, cucumbers or tzatziki  o Tuna salad on whole wheat bread: tuna salad made with greek yogurt, olives, red peppers, capers, green onions o Garlic rosemary lamb pita: lamb sauted with garlic, rosemary, salt & pepper; add lettuce, cucumber, greek yogurt to pita - flavor with lemon juice and black pepper  . Seafood:  o Mediterranean grilled salmon, seasoned with garlic, basil, parsley, lemon juice and black pepper o Shrimp, lemon, and spinach whole-grain pasta salad made with low fat greek yogurt  o Seared scallops with lemon orzo  o Seared tuna steaks seasoned salt, pepper, coriander topped with tomato mixture of olives, tomatoes, olive oil, minced garlic, parsley, green onions and cappers  . Meats:  o Herbed greek chicken salad  with kalamata olives, cucumber, feta  o Red bell peppers stuffed with spinach, bulgur, lean ground beef (or lentils) & topped with feta   o Kebabs: skewers of chicken, tomatoes, onions, zucchini, squash  o [place] burgers: made with red onions, mint, dill, lemon juice, feta cheese topped with roasted red peppers . Vegetarian o Cucumber salad: cucumbers, artichoke hearts, celery, red onion, feta cheese, tossed in olive oil & lemon juice  o Hummus and whole grain pita points with a greek salad (lettuce, tomato, feta, olives, cucumbers, red onion) o Lentil soup with celery, carrots made with vegetable broth, garlic, salt and pepper  o Tabouli salad: parsley, bulgur, mint, scallions, cucumbers, tomato, radishes, lemon juice, olive oil, salt and pepper.      for a total of 1 hour of exercise at least 3 days a week is ideal.

## 2016-09-01 NOTE — Assessment & Plan Note (Signed)
Patient is here with no significant changes from her previous visits. - Labs to be obtained today. - Discussed diet modifications and information on the Mediterranean diet provided. - Discussed increased activity with increasing heart rate in the 100-120 bpm range for a 60 minute period.

## 2016-09-01 NOTE — Assessment & Plan Note (Signed)
Notable ganglion cyst on right wrist. Significant tenderness to palpation on exam. No limitations with wrist function or strength. Discussed conservative management with ice/rest/anti-inflammatories. Patient would like further evaluation with hand surgeon. - Referral to hand surgery.

## 2016-09-01 NOTE — Progress Notes (Signed)
Georges Lynch, MD, MS Phone: [phone]  Subjective:  CC -- Annual Physical  Pt reports she fell in a restaurant earlier this year. Hurt the right wrist. Was seen in ED. No bony injury. Was told it was a ganglion cyst, and she might need to see a Copy.   Cardiovascular: - Risk as of 09/01/2016: unknown - Dx Hypertension: no  - Dx Hyperlipidemia: unknown  - Dx Obesity: yes, Class II - Physical Activity: no, walks some, but minimal  - Diabetes: no   Cancer: Colorectal >> Colonoscopy: no  Lung >> Tobacco Use: no Breast >> Mammogram: no, discussed starting; info given Cervical/Endometrial >>  - Postmenopausal: no  - Vaginal Bleeding: regular - Pap Smear: no   - Previous Abnormal Pap: no  Skin >> Suspicious lesions: no   Social: Alcohol Use: no  Tobacco Use: no  Other Drugs: no  Risky Sexual Behavior: no  Support and Life at Home: yes   Other: Osteoporosis: no Zoster Vaccine: no  Flu Vaccine: no  Pneumonia Vaccine: no   ROS- occasional lightheadedness when standing quickly. Otherwise all systems reviewed and negative.  Past Medical History Patient Active Problem List   Diagnosis Date Noted  . Ganglion cyst of wrist 09/01/2016  . History of anemia 05/07/2015  . Severe obesity (BMI >= 40) ([facility]) 06/12/2014  . Lightheadedness 06/12/2014  . Healthcare maintenance 06/19/2011  . Chronic mental illness 06/19/2011  . Anemia 06/15/2011  . GLAUCOMA NEC 09/08/2007    Medications- reviewed and updated Current Outpatient Prescriptions  Medication Sig Dispense Refill  . ferrous sulfate 325 (65 FE) MG tablet Take 325 mg by mouth daily.    Marland Kitchen ibuprofen (ADVIL,MOTRIN) 800 MG tablet Take 1 tablet (800 mg total) by mouth 3 (three) times daily. (Patient not taking: Reported on 06/26/2016) 30 tablet 0  . LATANOPROST OP Place 1 drop into both eyes at bedtime.    . metroNIDAZOLE (FLAGYL) 500 MG tablet Take 1 tablet (500 mg total) by mouth 2 (two) times daily. (Patient not taking:  Reported on 06/26/2016) 14 tablet 0  . naproxen (NAPROSYN) 500 MG tablet Take 1 tablet (500 mg total) by mouth 2 (two) times daily. 20 tablet 0   No current facility-administered medications for this visit.     Objective: BP 110/79   Pulse 97   Temp 99.3 F (37.4 C) (Oral)   Ht 5\' 6"  (1.676 m)   Wt 276 lb 12.8 oz (125.6 kg)   LMP 08/17/2016 (Approximate)   BMI 44.68 kg/m  Gen: NAD, alert, cooperative with exam, morbidly obese HEENT: NCAT, EOMI, PERRL CV: RRR, good S1/S2, no murmur Resp: CTABL, no wheezes, non-labored Abd: Soft, Non Tender, Non Distended, BS present, no guarding or organomegaly Ext: No edema, warm, tender nodule over the dorsal aspect of the right wrist. Full ROM in all extremities. Strength intact throughout. Finkelstein's negative. Neuro: Alert and oriented, No gross deficits   Assessment/Plan:  Healthcare maintenance Patient is here with no significant changes from her previous visits. - Labs to be obtained today. - Discussed diet modifications and information on the Mediterranean diet provided. - Discussed increased activity with increasing heart rate in the 100-120 bpm range for a 60 minute period.  Lightheadedness No syncopal events. Likely orthostatic in nature as patient has tested positive for orthostatic hypotension the past. Likely an associated component of deconditioning as well. - Encourage adequate hydration. - Encouraged safe and controlled exercise.  Ganglion cyst of wrist Notable ganglion cyst on right wrist. Significant tenderness  to palpation on exam. No limitations with wrist function or strength. Discussed conservative management with ice/rest/anti-inflammatories. Patient would like further evaluation with hand surgeon. - Referral to hand surgery.   Orders Placed This Encounter  Procedures  . TSH  . CBC  . BASIC METABOLIC PANEL WITH GFR  . Lipid panel  . Ambulatory referral to Hand Surgery    Referral Priority:   Routine     Referral Type:   Surgical    Referral Reason:   Specialty Services Required    Requested Specialty:   Hand Surgery    Number of Visits Requested:   1    Elberta Leatherwood, MD,MS,  PGY2 09/01/2016 5:44 PM

## 2016-09-02 LAB — TSH: TSH: 1.46 mIU/L

## 2016-09-03 ENCOUNTER — Encounter: Payer: Self-pay | Admitting: Family Medicine

## 2016-09-14 ENCOUNTER — Telehealth: Payer: Self-pay | Admitting: Family Medicine

## 2016-09-14 NOTE — Telephone Encounter (Signed)
Patient again, advised patient that our MD's are not in clinic everyday and it is unlikely that anything will be done today. Patient informed that I would forward message to MD.

## 2016-09-14 NOTE — Telephone Encounter (Signed)
Pt states she is on disabiliy and the power will be turned off tomorrow if she does not pay her bill today. Pt does not have any money and will not receive her disability check until October 3rd. Pt has been to social services and they told her the only thing she could do is get a letter from her doctor stating pt needs to keep power on for medical reasons. Pt has an 40 year old daughter living with her. Please advise. Thanks! ep

## 2016-09-15 ENCOUNTER — Telehealth: Payer: Self-pay | Admitting: Student

## 2016-09-15 NOTE — Telephone Encounter (Signed)
Brandy Foster from social services called to get confirmation on patient's asthma/breathing issues.  I informed him since patient was in his office that we have no documentation that patient is currently being treated for this problem.  He asked if patient had a condition related to her breathing that would he could use to help with energy assistance.  I informed that unfortunately I didn't see anything on her list related to her breathing. Brandy Foster,CMA

## 2016-09-15 NOTE — Telephone Encounter (Signed)
Spoke to patient and informed her that I would not be at the office today, but could be there to give her a note to help with her power tomorrow. Will write a letter and ask the blue team to print it and leave it at the front for her to pick up. Please inform the pateint

## 2016-09-15 NOTE — Telephone Encounter (Signed)
Patient was called to discuss issue regarding power but her mother answered the phone and did not know anything about losing power. She was asked to call the office again and we will try to help as we can regarding her power

## 2016-09-15 NOTE — Telephone Encounter (Signed)
Letter printed and placed up front for pick up. Jazmin Hartsell,CMA

## 2016-12-29 ENCOUNTER — Encounter: Payer: Self-pay | Admitting: Obstetrics and Gynecology

## 2016-12-29 ENCOUNTER — Ambulatory Visit (INDEPENDENT_AMBULATORY_CARE_PROVIDER_SITE_OTHER): Payer: Medicare Other | Admitting: Obstetrics and Gynecology

## 2016-12-29 VITALS — BP 110/64 | HR 92 | Temp 98.1°F | Wt 264.0 lb

## 2016-12-29 DIAGNOSIS — J069 Acute upper respiratory infection, unspecified: Secondary | ICD-10-CM | POA: Diagnosis present

## 2016-12-29 DIAGNOSIS — B9789 Other viral agents as the cause of diseases classified elsewhere: Secondary | ICD-10-CM

## 2016-12-29 NOTE — Patient Instructions (Signed)
Upper Respiratory Infection, Adult Most upper respiratory infections (URIs) are a viral infection of the air passages leading to the lungs. A URI affects the nose, throat, and upper air passages. The most common type of URI is nasopharyngitis and is typically referred to as "the common cold." URIs run their course and usually go away on their own. Most of the time, a URI does not require medical attention, but sometimes a bacterial infection in the upper airways can follow a viral infection. This is called a secondary infection. Sinus and middle ear infections are common types of secondary upper respiratory infections. Bacterial pneumonia can also complicate a URI. A URI can worsen asthma and chronic obstructive pulmonary disease (COPD). Sometimes, these complications can require emergency medical care and may be life threatening. What are the causes? Almost all URIs are caused by viruses. A virus is a type of germ and can spread from one person to another. What increases the risk? You may be at risk for a URI if:  You smoke.  You have chronic heart or lung disease.  You have a weakened defense (immune) system.  You are very young or very old.  You have nasal allergies or asthma.  You work in crowded or poorly ventilated areas.  You work in health care facilities or schools.  What are the signs or symptoms? Symptoms typically develop 2-3 days after you come in contact with a cold virus. Most viral URIs last 7-10 days. However, viral URIs from the influenza virus (flu virus) can last 14-18 days and are typically more severe. Symptoms may include:  Runny or stuffy (congested) nose.  Sneezing.  Cough.  Sore throat.  Headache.  Fatigue.  Fever.  Loss of appetite.  Pain in your forehead, behind your eyes, and over your cheekbones (sinus pain).  Muscle aches.  How is this diagnosed? Your health care provider may diagnose a URI by:  Physical exam.  Tests to check that your  symptoms are not due to another condition such as: ? Strep throat. ? Sinusitis. ? Pneumonia. ? Asthma.  How is this treated? A URI goes away on its own with time. It cannot be cured with medicines, but medicines may be prescribed or recommended to relieve symptoms. Medicines may help:  Reduce your fever.  Reduce your cough.  Relieve nasal congestion.  Follow these instructions at home:  Take medicines only as directed by your health care provider.  Gargle warm saltwater or take cough drops to comfort your throat as directed by your health care provider.  Use a warm mist humidifier or inhale steam from a shower to increase air moisture. This may make it easier to breathe.  Drink enough fluid to keep your urine clear or pale yellow.  Eat soups and other clear broths and maintain good nutrition.  Rest as needed.  Return to work when your temperature has returned to normal or as your health care provider advises. You may need to stay home longer to avoid infecting others. You can also use a face mask and careful hand washing to prevent spread of the virus.  Increase the usage of your inhaler if you have asthma.  Do not use any tobacco products, including cigarettes, chewing tobacco, or electronic cigarettes. If you need help quitting, ask your health care provider. How is this prevented? The best way to protect yourself from getting a cold is to practice good hygiene.  Avoid oral or hand contact with people with cold symptoms.  Wash your   hands often if contact occurs.  There is no clear evidence that vitamin C, vitamin E, echinacea, or exercise reduces the chance of developing a cold. However, it is always recommended to get plenty of rest, exercise, and practice good nutrition. Contact a health care provider if:  You are getting worse rather than better.  Your symptoms are not controlled by medicine.  You have chills.  You have worsening shortness of breath.  You have  brown or red mucus.  You have yellow or brown nasal discharge.  You have pain in your face, especially when you bend forward.  You have a fever.  You have swollen neck glands.  You have pain while swallowing.  You have white areas in the back of your throat. Get help right away if:  You have severe or persistent: ? Headache. ? Ear pain. ? Sinus pain. ? Chest pain.  You have chronic lung disease and any of the following: ? Wheezing. ? Prolonged cough. ? Coughing up blood. ? A change in your usual mucus.  You have a stiff neck.  You have changes in your: ? Vision. ? Hearing. ? Thinking. ? Mood. This information is not intended to replace advice given to you by your health care provider. Make sure you discuss any questions you have with your health care provider. Document Released: 06/02/2001 Document Revised: 08/09/2016 Document Reviewed: 03/14/2014 Elsevier Interactive Patient Education  2017 Elsevier Inc.  

## 2016-12-29 NOTE — Progress Notes (Signed)
   Subjective:   Patient ID: Brandy Foster, female    DOB: 1976/04/13, 41 y.o.   MRN: [id number]  Patient presents for Same Day Appointment  Chief Complaint  Patient presents with  . Nasal Congestion    HPI: #  Upper Respiratory Infection Patient complains of symptoms of a URI. Symptoms include congestion, cough described as productive, headache, no  fever and sneezing. Also having some associated left ear pain. Onset of symptoms was 5 days ago, and has been gradually improving since that time. Treatment to date: OTC decongestants. Sick contacts: yes.    Review of Systems   See HPI for ROS.   History  Smoking Status  . Never Smoker  Smokeless Tobacco  . Never Used    Past medical history, surgical, family, and social history reviewed and updated in the EMR as appropriate.  Objective:  BP 110/64   Pulse 92   Temp 98.1 F (36.7 C) (Oral)   Wt 264 lb (119.7 kg)   LMP 12/14/2016   SpO2 99%   BMI 42.61 kg/m  Vitals and nursing note reviewed  Physical Exam  Constitutional: She is well-developed, well-nourished, and in no distress.  HENT:  Right Ear: Tympanic membrane, external ear and ear canal normal.  Left Ear: Tympanic membrane, external ear and ear canal normal.  Nose: Mucosal edema present. Right sinus exhibits no maxillary sinus tenderness and no frontal sinus tenderness. Left sinus exhibits no maxillary sinus tenderness and no frontal sinus tenderness.  Mouth/Throat: Oropharynx is clear and moist and mucous membranes are normal.  Eyes: EOM are normal. Pupils are equal, round, and reactive to light.  Neck: Normal range of motion. Neck supple. No thyromegaly present.  Cardiovascular: Normal rate and regular rhythm.   Pulmonary/Chest: Effort normal and breath sounds normal.  Lymphadenopathy:    She has no cervical adenopathy.    Assessment & Plan:  41 y.o. female  1. Viral URI - Improving -conservative measures -reassurance given -follow-up prn  PATIENT  EDUCATION PROVIDED: See AVS   Luiz Blare, DO 12/29/2016, 11:28 AM PGY-3, [place]

## 2017-02-02 DIAGNOSIS — H43813 Vitreous degeneration, bilateral: Secondary | ICD-10-CM | POA: Diagnosis not present

## 2017-02-17 DIAGNOSIS — H33321 Round hole, right eye: Secondary | ICD-10-CM | POA: Diagnosis not present

## 2017-03-30 ENCOUNTER — Encounter: Payer: Self-pay | Admitting: Family Medicine

## 2017-03-30 ENCOUNTER — Ambulatory Visit (INDEPENDENT_AMBULATORY_CARE_PROVIDER_SITE_OTHER): Payer: Medicare Other | Admitting: Family Medicine

## 2017-03-30 VITALS — BP 108/66 | HR 90 | Temp 98.5°F | Wt 272.0 lb

## 2017-03-30 DIAGNOSIS — L720 Epidermal cyst: Secondary | ICD-10-CM | POA: Diagnosis present

## 2017-03-30 NOTE — Patient Instructions (Signed)
Epidermal Cyst An epidermal cyst is sometimes called an epidermal inclusion cyst or an infundibular cyst. It is a sac made of skin tissue. The sac contains a substance called keratin. Keratin is a protein that is normally secreted through the hair follicles. When keratin becomes trapped in the top layer of skin (epidermis), it can form an epidermal cyst. Epidermal cysts are usually found on the face, neck, trunk, and genitals. These cysts are usually harmless (benign), and they may not cause symptoms unless they become infected. It is important not to pop epidermal cysts yourself. What are the causes? This condition may be caused by:  A blocked hair follicle.  A hair that curls and re-enters the skin instead of growing straight out of the skin (ingrown hair).  A blocked pore.  Irritated skin.  An injury to the skin.  Certain conditions that are passed along from parent to child (inherited).  Human papillomavirus (HPV). What increases the risk? The following factors may make you more likely to develop an epidermal cyst:  Having acne.  Being overweight.  Wearing tight clothing. What are the signs or symptoms? The only symptom of this condition may be a small, painless lump underneath the skin. When an epidermal cyst becomes infected, symptoms may include:  Redness.  Inflammation.  Tenderness.  Warmth.  Fever.  Keratin draining from the cyst. Keratin may look like a grayish-white, bad-smelling substance.  Pus draining from the cyst. How is this diagnosed? This condition is diagnosed with a physical exam. In some cases, you may have a sample of tissue (biopsy) taken from your cyst to be examined under a microscope or tested for bacteria. You may be referred to a health care provider who specializes in skin care (dermatologist). How is this treated? In many cases, epidermal cysts go away on their own without treatment. If a cyst becomes infected, treatment may  include:  Opening and draining the cyst. After draining, minor surgery to remove the rest of the cyst may be done.  Antibiotic medicine to help prevent infection.  Injections of medicines (steroids) that help to reduce inflammation.  Surgery to remove the cyst. Surgery may be done if:  The cyst becomes large.  The cyst bothers you.  There is a chance that the cyst could turn into cancer. Follow these instructions at home:  Take over-the-counter and prescription medicines only as told by your health care provider.  If you were prescribed an antibiotic, use it as told by your health care provider. Do not stop using the antibiotic even if you start to feel better.  Keep the area around your cyst clean and dry.  Wear loose, dry clothing.  Do not try to pop your cyst.  Avoid touching your cyst.  Check your cyst every day for signs of infection.  Keep all follow-up visits as told by your health care provider. This is important. How is this prevented?  Wear clean, dry, clothing.  Avoid wearing tight clothing.  Keep your skin clean and dry. Shower or take baths every day.  Wash your body with a benzoyl peroxide wash when you shower or bathe. Contact a health care provider if:  Your cyst develops symptoms of infection.  Your condition is not improving or is getting worse.  You develop a cyst that looks different from other cysts you have had.  You have a fever. Get help right away if:  Redness spreads from the cyst into the surrounding area. This information is not intended to replace  if:  · Your cyst develops symptoms of infection.  · Your condition is not improving or is getting worse.  · You develop a cyst that looks different from other cysts you have had.  · You have a fever.  Get help right away if:  · Redness spreads from the cyst into the surrounding area.  This information is not intended to replace advice given to you by your health care provider. Make sure you discuss any questions you have with your health care provider.  Document Released: 11/07/2004 Document Revised: 08/05/2016 Document Reviewed: 10/09/2015  Elsevier Interactive Patient Education © 2017 Elsevier Inc.

## 2017-03-30 NOTE — Progress Notes (Signed)
    Subjective:  Brandy Foster is a 41 y.o. female who presents to the [facility] today with a chief complaint of skin cyst.   HPI:  Cysts Patient has had a "lump" at the back of her neck for the past several years. A few days ago the lump started getting larger. She put warm rags on it daily and it eventually busted. Reports that foul smelling white material came out. No fevers or chills. No pain.   ROS: Per HPI  PMH: Smoking history reviewed.   Objective:  Physical Exam: BP 108/66   Pulse 90   Temp 98.5 F (36.9 C) (Oral)   Wt 272 lb (123.4 kg)   LMP  (LMP Unknown)   SpO2 98%   BMI 43.90 kg/m   Gen: NAD, resting comfortably Skin: Approximately 1.5cm exophytic mass on central aspect at back of neck consistent with epidermoid cyst. No purulent drainage. No surrounding erythema.  Neuro: grossly normal, moves all extremities Psych: Normal affect and thought content    Assessment/Plan:  Epidermoid Cyst No signs of infection. Recommended excision for definitive treatment. Scheduled for derm clinic next week. Recommended continued warm compresses until then and to keep the area clean. Return precautions reviewed.   Algis Greenhouse. Jerline Pain, [place] Resident PGY-3 03/30/2017 11:29 AM

## 2017-04-07 ENCOUNTER — Ambulatory Visit: Payer: Medicare Other

## 2017-04-17 DIAGNOSIS — J029 Acute pharyngitis, unspecified: Secondary | ICD-10-CM | POA: Diagnosis not present

## 2017-04-17 DIAGNOSIS — R0981 Nasal congestion: Secondary | ICD-10-CM | POA: Diagnosis not present

## 2017-04-17 DIAGNOSIS — J40 Bronchitis, not specified as acute or chronic: Secondary | ICD-10-CM | POA: Diagnosis not present

## 2017-04-17 DIAGNOSIS — R05 Cough: Secondary | ICD-10-CM | POA: Diagnosis not present

## 2017-04-17 DIAGNOSIS — J028 Acute pharyngitis due to other specified organisms: Secondary | ICD-10-CM | POA: Diagnosis not present

## 2017-05-27 DIAGNOSIS — D485 Neoplasm of uncertain behavior of skin: Secondary | ICD-10-CM | POA: Diagnosis not present

## 2017-06-08 DIAGNOSIS — H40153 Residual stage of open-angle glaucoma, bilateral: Secondary | ICD-10-CM | POA: Diagnosis not present

## 2017-07-15 ENCOUNTER — Other Ambulatory Visit (HOSPITAL_COMMUNITY)
Admission: RE | Admit: 2017-07-15 | Discharge: 2017-07-15 | Disposition: A | Discharge status: Home / Self Care | Payer: Medicare Other | Source: Ambulatory Visit | Attending: Family Medicine | Admitting: Family Medicine

## 2017-07-15 ENCOUNTER — Telehealth: Payer: Self-pay

## 2017-07-15 ENCOUNTER — Ambulatory Visit (INDEPENDENT_AMBULATORY_CARE_PROVIDER_SITE_OTHER): Payer: Medicare Other | Admitting: Family Medicine

## 2017-07-15 ENCOUNTER — Encounter: Payer: Self-pay | Admitting: Family Medicine

## 2017-07-15 VITALS — BP 112/64 | HR 74 | Temp 97.8°F | Ht 68.0 in | Wt 275.0 lb

## 2017-07-15 DIAGNOSIS — L298 Other pruritus: Secondary | ICD-10-CM | POA: Insufficient documentation

## 2017-07-15 DIAGNOSIS — N912 Amenorrhea, unspecified: Secondary | ICD-10-CM | POA: Diagnosis not present

## 2017-07-15 DIAGNOSIS — Z3044 Encounter for surveillance of vaginal ring hormonal contraceptive device: Secondary | ICD-10-CM | POA: Diagnosis not present

## 2017-07-15 DIAGNOSIS — N898 Other specified noninflammatory disorders of vagina: Secondary | ICD-10-CM

## 2017-07-15 DIAGNOSIS — Z113 Encounter for screening for infections with a predominantly sexual mode of transmission: Secondary | ICD-10-CM | POA: Insufficient documentation

## 2017-07-15 LAB — POCT WET PREP (WET MOUNT)
Clue Cells Wet Prep Whiff POC: NEGATIVE
Trichomonas Wet Prep HPF POC: ABSENT

## 2017-07-15 LAB — POCT URINE PREGNANCY: Preg Test, Ur: NEGATIVE

## 2017-07-15 MED ORDER — FLUCONAZOLE 150 MG PO TABS: 150.0000 mg | ORAL_TABLET | Freq: Every day | ORAL | 2 refills | Status: DC

## 2017-07-15 MED ORDER — ETONOGESTREL-ETHINYL ESTRADIOL 0.12-0.015 MG/24HR VA RING: VAGINAL_RING | VAGINAL | 12 refills | Status: DC

## 2017-07-15 NOTE — Telephone Encounter (Signed)
-----   Message from Donnamae Jude, MD sent at 07/15/2017  4:16 PM EDT ----- Has yeast--rx for Diflucan sent to pharmacy--please inform patient.

## 2017-07-15 NOTE — Telephone Encounter (Signed)
Pt contacted and notified of positive yeast and rx for diflucan to pharmacy. Directions given and all questions answered.

## 2017-07-15 NOTE — Progress Notes (Signed)
   Subjective:    Patient ID: Brandy Foster is a 41 y.o. female presenting with Vaginal Itching  on 07/15/2017  HPI: Couple day h/o vaginal irritation with itching and burning. Small amount of discharge, which is white. LMP was about one month ago. Cycles are usually regular. No protection is being used.  Review of Systems  Constitutional: Negative for chills and fever.  Respiratory: Negative for shortness of breath.   Cardiovascular: Negative for chest pain.  Gastrointestinal: Negative for abdominal pain, nausea and vomiting.  Genitourinary: Negative for dysuria.  Skin: Negative for rash.      Objective:    BP 112/64   Pulse 74   Temp 97.8 F (36.6 C) (Oral)   Ht 5\' 8"  (1.727 m)   Wt 275 lb (124.7 kg)   LMP 06/15/2017   BMI 41.81 kg/m  Physical Exam  Constitutional: She is oriented to person, place, and time. She appears well-developed and well-nourished. No distress.  HENT:  Head: Normocephalic and atraumatic.  Eyes: No scleral icterus.  Neck: Neck supple.  Cardiovascular: Normal rate.   Pulmonary/Chest: Effort normal.  Abdominal: Soft.  Genitourinary: Vaginal discharge (white) found.  Neurological: She is alert and oriented to person, place, and time.  Skin: Skin is warm and dry.  Psychiatric: She has a normal mood and affect.  Patient declined speculum exam and pap smear today      Assessment & Plan:  Vaginal itching - wet prep c/w yeast--treat with Diflucan - Plan: POCT Wet Prep (Wet [place]), fluconazole (DIFLUCAN) 150 MG tablet  Screen for STD (sexually transmitted disease) - unprotected intercourse--check labs - Plan: RPR, HIV antibody, Hepatitis C antibody, Hepatitis B surface antigen, Urine cytology ancillary only  Amenorrhea - Plan: POCT urine pregnancy  Encounter for surveillance of vaginal ring hormonal contraceptive device - placed on NuvaRing--begin after next menses - Plan: etonogestrel-ethinyl estradiol (NUVARING) 0.12-0.015 MG/24HR vaginal  ring   Total face-to-face time with patient: 15 minutes. Over 50% of encounter was spent on counseling and coordination of care. Return if symptoms worsen or fail to improve.  Declined pap testing today--still needs this.  Donnamae Jude 07/15/2017 2:36 PM

## 2017-07-15 NOTE — Patient Instructions (Signed)

## 2017-07-16 LAB — RPR: RPR Ser Ql: NONREACTIVE

## 2017-07-16 LAB — URINE CYTOLOGY ANCILLARY ONLY
Chlamydia: NEGATIVE
Neisseria Gonorrhea: NEGATIVE

## 2017-07-16 LAB — HEPATITIS C ANTIBODY: Hep C Virus Ab: <0.1 s/co ratio (ref 0.0–0.9)

## 2017-07-16 LAB — HEPATITIS B SURFACE ANTIGEN: Hepatitis B Surface Ag: NEGATIVE

## 2017-07-16 LAB — HIV ANTIBODY (ROUTINE TESTING W REFLEX): HIV Screen 4th Generation wRfx: NONREACTIVE

## 2017-09-23 DIAGNOSIS — H40153 Residual stage of open-angle glaucoma, bilateral: Secondary | ICD-10-CM | POA: Diagnosis not present

## 2017-11-03 DIAGNOSIS — D492 Neoplasm of unspecified behavior of bone, soft tissue, and skin: Secondary | ICD-10-CM | POA: Diagnosis not present

## 2017-12-02 ENCOUNTER — Ambulatory Visit (INDEPENDENT_AMBULATORY_CARE_PROVIDER_SITE_OTHER): Payer: Medicare Other | Admitting: Family Medicine

## 2017-12-02 VITALS — BP 110/70 | HR 95 | Temp 98.0°F | Ht 68.0 in | Wt 279.0 lb

## 2017-12-02 DIAGNOSIS — J069 Acute upper respiratory infection, unspecified: Secondary | ICD-10-CM | POA: Diagnosis not present

## 2017-12-02 DIAGNOSIS — B9789 Other viral agents as the cause of diseases classified elsewhere: Secondary | ICD-10-CM | POA: Diagnosis not present

## 2017-12-02 DIAGNOSIS — J029 Acute pharyngitis, unspecified: Secondary | ICD-10-CM | POA: Diagnosis present

## 2017-12-02 LAB — POCT RAPID STREP A (OFFICE): Rapid Strep A Screen: NEGATIVE

## 2017-12-02 MED ORDER — GUAIFENESIN ER 600 MG PO TB12: 600.0000 mg | ORAL_TABLET | Freq: Two times a day (BID) | ORAL | 0 refills | Status: DC

## 2017-12-02 NOTE — Progress Notes (Signed)
rapid

## 2017-12-02 NOTE — Patient Instructions (Addendum)
You were seen in clinic for sore throat and fever.  Your symptoms are most likely related to a viral upper respiratory infection or the flu.  As we discussed, antibiotics would not play a role in your treatment as this is viral.  I would like for you to continue drinking warm fluids such as teas, soups and broth.  It is important that he stay well-hydrated while you recover.  I would also encourage frequent handwashing as this will prevent spread to other family members.  You can take Tylenol for fever/pain.  I have prescribed you some Mucinex that will help with your symptoms of cough and congestion.  If you develop any new or worsening symptoms I would like for you to come back to be seen.  Be well,  Lovenia Kim, MD

## 2017-12-02 NOTE — Progress Notes (Addendum)
   Subjective:   Patient ID: Brandy Foster    DOB: 05-Nov-1976, 41 y.o. female   MRN: [id number]  CC: sore throat  HPI: Brandy Foster is a 41 y.o. female who presents to clinic today for sore throat and fever.  Symptoms started earlier this week.  She had a fever Tuesday night and did not check temp at home. Took Tylenol and went to bed however woke up feeling the same with body aches and headache last night. Cough is productive of clear sputum.  She also endorses runny nose, congestion.  She has been eating and drinking well; drinking soups, broths, water.  Tried Tylenol, Nyquil and Alka Seltzer x 2 days.  Her daughter is sick with similar symptoms since last week.  Denies nausea, vomiting, diarrhea, abdominal pain, shortness of breath, chest pain   ROS: See HPI for pertinent ROS. [place]: Pertinent past medical, surgical, family, and social history were reviewed and updated as appropriate. Smoking status reviewed. Medications reviewed.  Objective:   BP 110/70 (BP Location: Left Arm, Patient Position: Sitting, Cuff Size: Normal)   Pulse 95   Temp 98 F (36.7 C) (Oral)   Ht 5\' 8"  (1.727 m)   Wt 279 lb (126.6 kg)   SpO2 99%   BMI 42.42 kg/m  Vitals and nursing note reviewed.  General: 41 yo female, NAD  HEENT: NCAT, EOMI, PERRL, MMM, o/p clear without tonsillar exudate or erythema, mild rhinorrhea present  Neck: supple, non-tender CV: RRR no MRG  Lungs: CTAB, non-laboured  Abdomen: soft, NTND, +bs  Skin: warm, dry, no rash Extremities: warm and well perfused  Assessment & Plan:   Viral URI with cough Likely viral vs influenza.  She is out of the window for flu testing and would not benefit from Tamiflu.  Rapid strep in OV negative.  -encourage warm fluids and honey for sore throat  -Tylenol for fever/pain -Rx Mucinex provided  -Advised frequent handwashing to avoid spread   -return precautions discussed   Orders Placed This Encounter  Procedures  . POCT rapid strep A    Meds ordered this encounter  Medications  . guaiFENesin (MUCINEX) 600 MG 12 hr tablet    Sig: Take 1 tablet (600 mg total) by mouth 2 (two) times daily.    Dispense:  16 tablet    Refill:  0   Follow up: if symptoms do not improve   Lovenia Kim, MD [place], PGY-2 12/10/2017 10:53 AM

## 2017-12-06 ENCOUNTER — Ambulatory Visit (INDEPENDENT_AMBULATORY_CARE_PROVIDER_SITE_OTHER): Payer: Medicare Other | Admitting: Family Medicine

## 2017-12-06 ENCOUNTER — Encounter: Payer: Self-pay | Admitting: Family Medicine

## 2017-12-06 ENCOUNTER — Other Ambulatory Visit: Payer: Self-pay

## 2017-12-06 VITALS — BP 110/68 | HR 107 | Temp 98.5°F | Wt 277.4 lb

## 2017-12-06 DIAGNOSIS — J029 Acute pharyngitis, unspecified: Secondary | ICD-10-CM

## 2017-12-06 DIAGNOSIS — B9789 Other viral agents as the cause of diseases classified elsewhere: Secondary | ICD-10-CM

## 2017-12-06 DIAGNOSIS — J028 Acute pharyngitis due to other specified organisms: Secondary | ICD-10-CM

## 2017-12-06 NOTE — Progress Notes (Signed)
Subjective:     Brandy Foster is a 42 y.o. female who presents for evaluation of sore throat. Associated symptoms include chest congestion, headache, post nasal drip, sinus and nasal congestion and sore throat. Onset of symptoms was 2 week ago, and have been gradually improving since that time. She is drinking plenty of fluids. She has not had a recent close exposure to someone with proven streptococcal pharyngitis.  The following portions of the patient's history were reviewed and updated as appropriate: current medications and problem list.  Review of Systems Pertinent items noted in HPI and remainder of comprehensive ROS otherwise negative.    Objective:    BP 110/68   Pulse (!) 107   Temp 98.5 F (36.9 C) (Oral)   Wt 277 lb 6.4 oz (125.8 kg)   SpO2 98%   BMI 42.18 kg/m  General appearance: alert, cooperative and no distress Head: Normocephalic, without obvious abnormality, atraumatic, sinuses nontender to percussion Nose: Nares normal. Septum midline. Mucosa normal. No drainage or sinus tenderness., mild congestion, no sinus tenderness, no polyps Throat: Mallampati stage 3-4. Unable to see throat well even with tounge depressor Neck: no adenopathy, no carotid bruit, no JVD, supple, symmetrical, trachea midline and thyroid not enlarged, symmetric, no tenderness/mass/nodules Lungs: clear to auscultation bilaterally and normal work of breathing Heart: tachycardic, regular rhythm, no mrg  Laboratory Strep test not done.   Assessment and Plan:    1. a Acute pharyngitis, likely  Viral pharyngitis.  Recent negative HIV, so I do not believe she has HIV pharyngitis. No lymphadenopathy or fever (and well appearing) makes Mononucleosis less likely. Centor critieria 1 and recent negative POCT strep on 12/13 make streptococcal pharyngitis less likely. There is no PE findings consistent with lower lung involvement making PNA unlikely.   Advised scheduling tylenol and ibuprofen (stagerred)  and Cepacol drops for throat pain. With congestion, she appears to have element of post nasal drip. Advised afrin spray with nasal saline for congestion relief. Advised patient to return in two weeks if not improving.   2. Tachycardia. Acute illness can cause tachycardia, but patient also has h/o anemia. Last Hg was 9.8 about 1 year ago. Advised patient to follow up with PCP after illness resolves to consider rechecking her hemoglobin if tachycardia still is present.

## 2017-12-06 NOTE — Patient Instructions (Addendum)
Use OTC Cepacol cepacol lozenge and ibuprofen for throat pain. Use OTC nasal decongestant such as afrin and nasal saline for nasal congestion. Come back to clinic in two weeks if this does not improve. Your heart rate was elevated. This may be due to anemia. Follow up with your PCP to have your blood levels check.   Pharyngitis Pharyngitis is a sore throat (pharynx). There is redness, pain, and swelling of your throat. Follow these instructions at home:  Drink enough fluids to keep your pee (urine) clear or pale yellow.  Only take medicine as told by your doctor. ? You may get sick again if you do not take medicine as told. Finish your medicines, even if you start to feel better. ? Do not take aspirin.  Rest.  Rinse your mouth (gargle) with salt water ( tsp of salt per 1 qt of water) every 1-2 hours. This will help the pain.  If you are not at risk for choking, you can suck on hard candy or sore throat lozenges. Contact a doctor if:  You have large, tender lumps on your neck.  You have a rash.  You cough up green, yellow-brown, or bloody spit. Get help right away if:  You have a stiff neck.  You drool or cannot swallow liquids.  You throw up (vomit) or are not able to keep medicine or liquids down.  You have very bad pain that does not go away with medicine.  You have problems breathing (not from a stuffy nose). This information is not intended to replace advice given to you by your health care provider. Make sure you discuss any questions you have with your health care provider. Document Released: 05/25/2008 Document Revised: 05/14/2016 Document Reviewed: 08/14/2013 Elsevier Interactive Patient Education  2017 [employer].

## 2017-12-10 DIAGNOSIS — J069 Acute upper respiratory infection, unspecified: Secondary | ICD-10-CM | POA: Insufficient documentation

## 2017-12-10 DIAGNOSIS — B9789 Other viral agents as the cause of diseases classified elsewhere: Secondary | ICD-10-CM

## 2017-12-10 NOTE — Assessment & Plan Note (Addendum)
Likely viral vs influenza.  She is out of the window for flu testing and would not benefit from Tamiflu.  Rapid strep in OV negative.  -encourage warm fluids and honey for sore throat  -Tylenol for fever/pain -Rx Mucinex provided  -Advised frequent handwashing to avoid spread   -return precautions discussed

## 2018-02-26 IMAGING — DX DG WRIST COMPLETE 3+V*R*
4 series · 4 of 4 positions shown · non-contrast
Comparison: None.

CLINICAL DATA: Status post fall 1 month ago with a right wrist
injury. Continued pain. Initial encounter.

EXAM:
RIGHT WRIST - COMPLETE 3+ VIEW

[wrist pa]
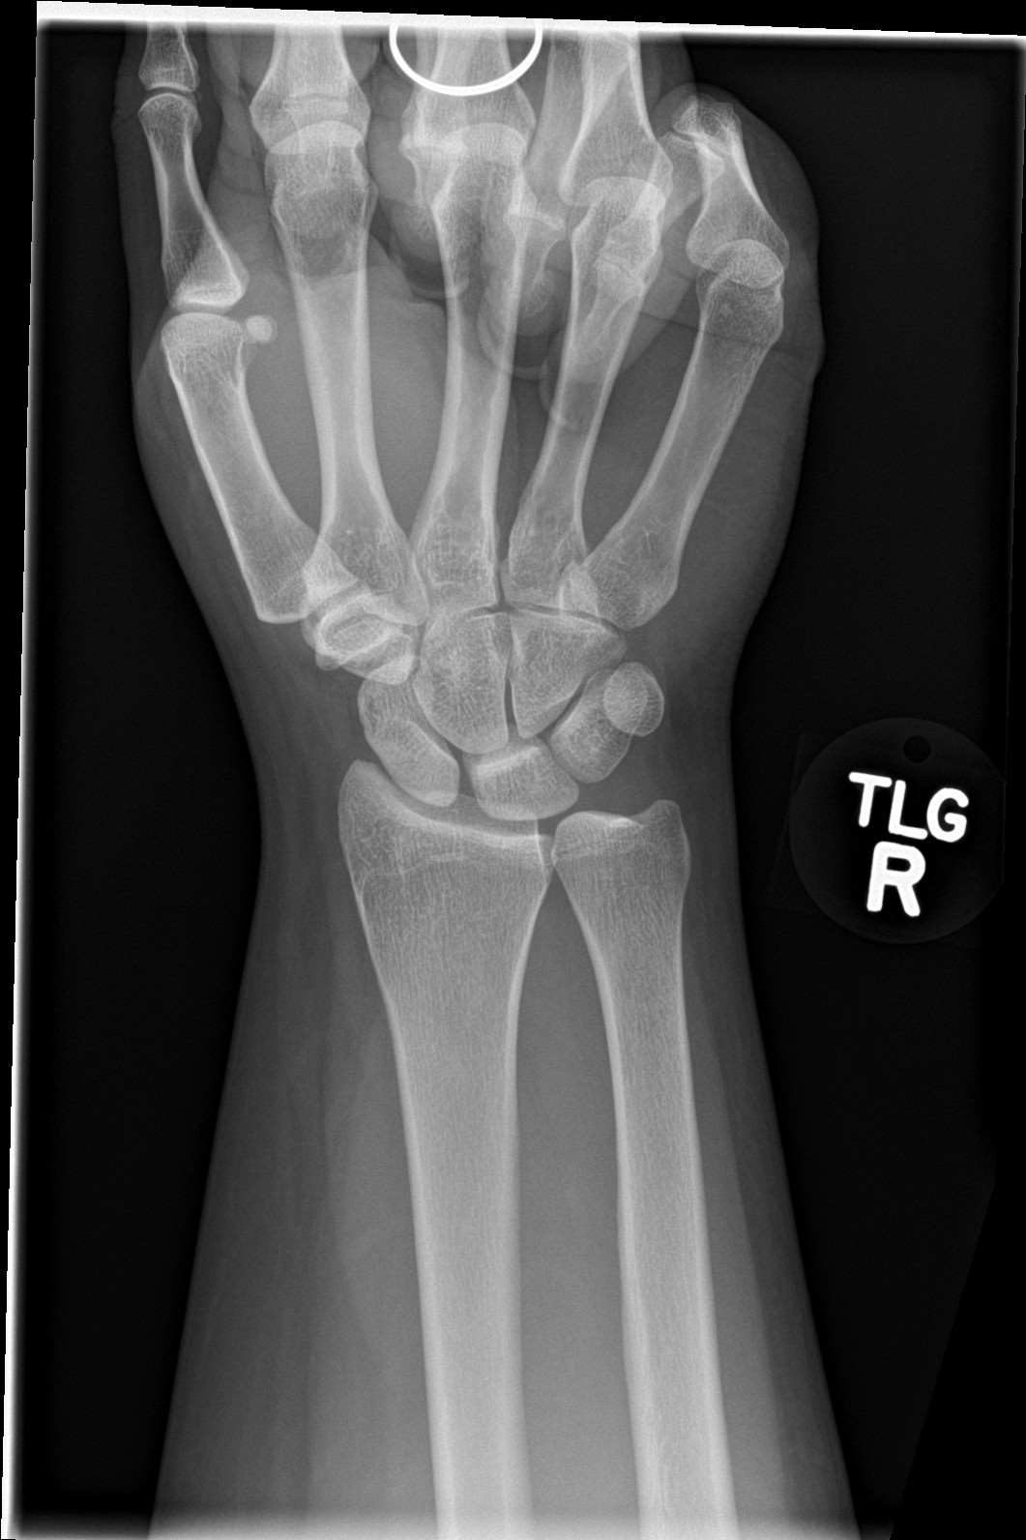

[wrist obl]
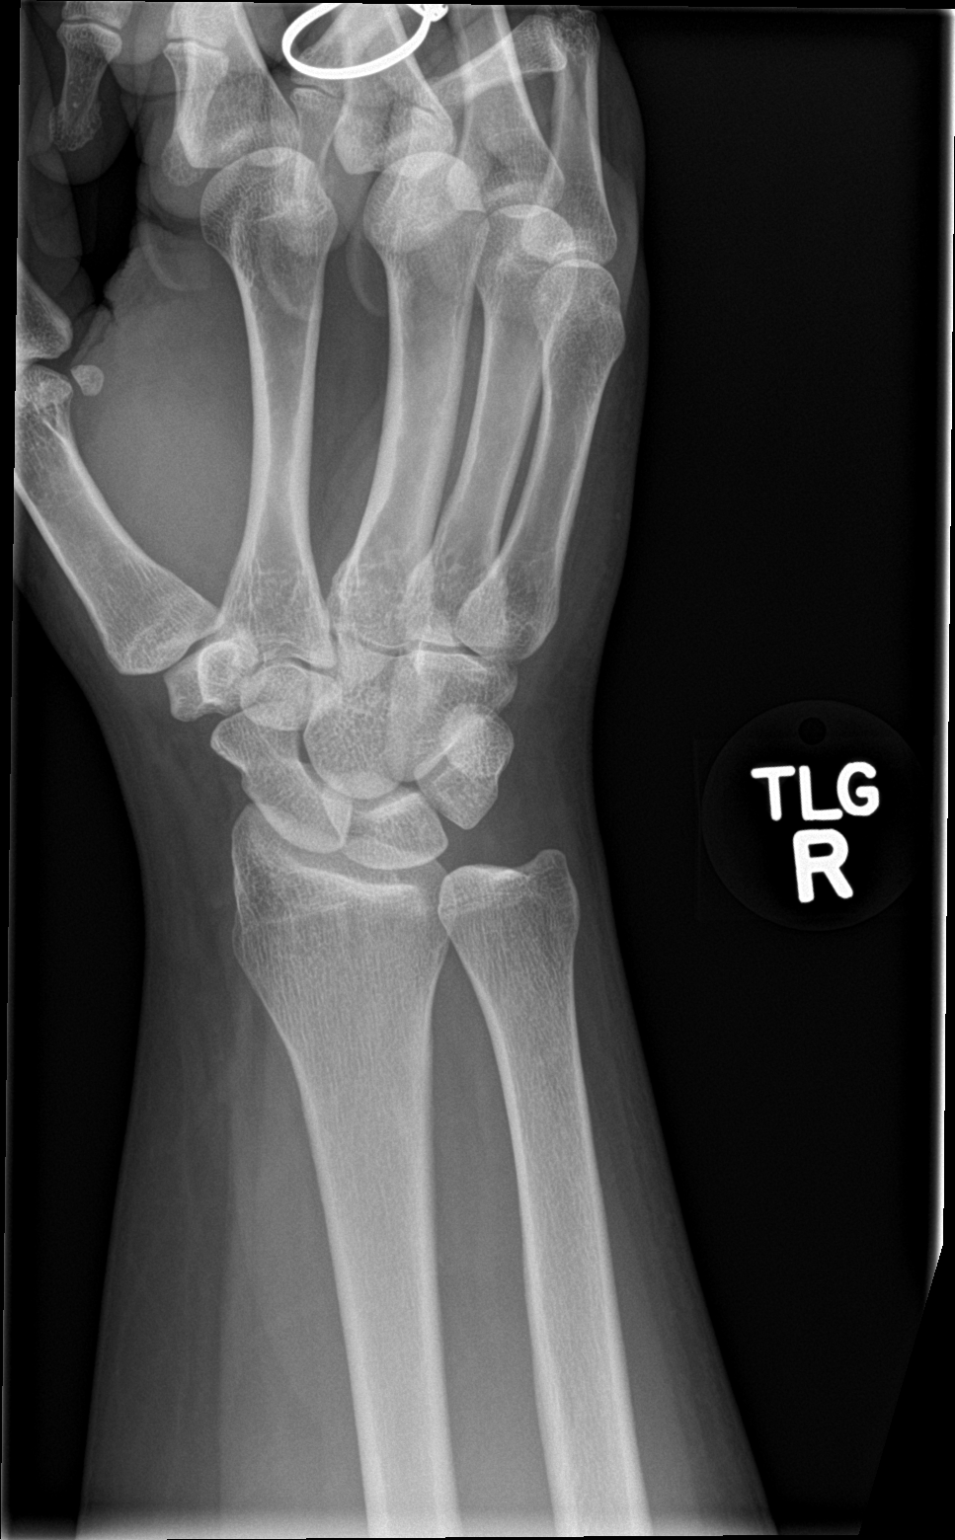

[wrist lat]
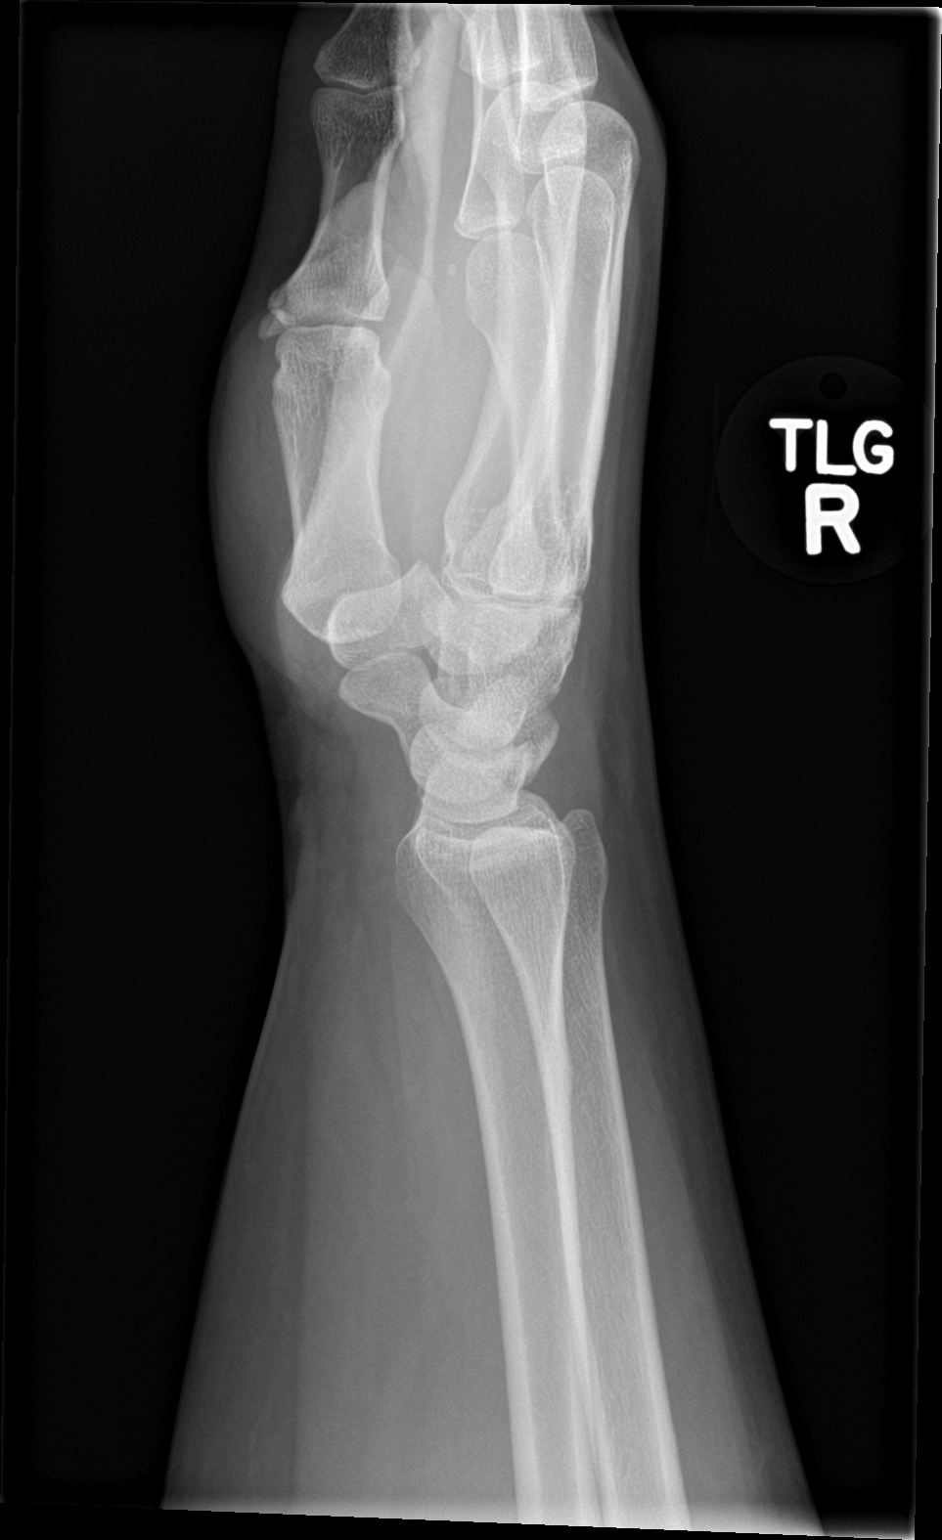

[wrist navicular]
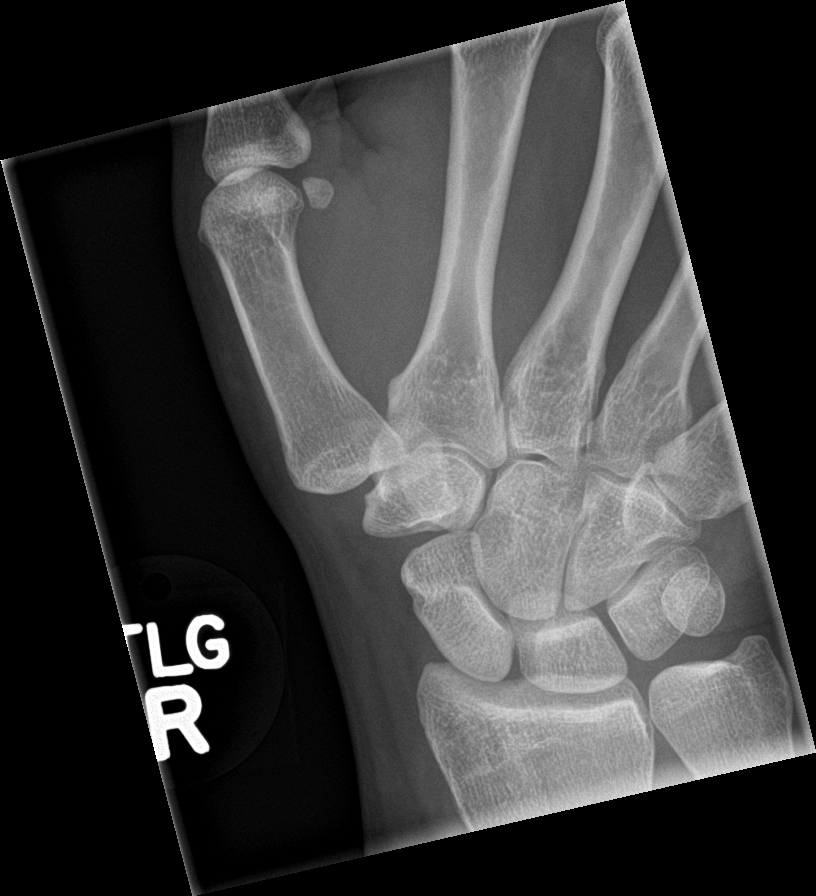

[4 of 4 positions shown; findings below may reference images not displayed]

FINDINGS: There is no evidence of fracture or dislocation. There is no
evidence of arthropathy or other focal bone abnormality. Soft
tissues are unremarkable.
IMPRESSION: Negative exam.

## 2018-09-07 ENCOUNTER — Ambulatory Visit (INDEPENDENT_AMBULATORY_CARE_PROVIDER_SITE_OTHER): Payer: Medicare Other | Admitting: Family Medicine

## 2018-09-07 ENCOUNTER — Other Ambulatory Visit: Payer: Self-pay

## 2018-09-07 VITALS — BP 136/82 | Temp 98.2°F | Wt 285.0 lb

## 2018-09-07 DIAGNOSIS — Z862 Personal history of diseases of the blood and blood-forming organs and certain disorders involving the immune mechanism: Secondary | ICD-10-CM

## 2018-09-07 DIAGNOSIS — R42 Dizziness and giddiness: Secondary | ICD-10-CM | POA: Diagnosis not present

## 2018-09-07 MED ORDER — FERROUS SULFATE 325 (65 FE) MG PO TABS: 325.0000 mg | ORAL_TABLET | Freq: Every day | ORAL | 3 refills | Status: DC

## 2018-09-07 NOTE — Assessment & Plan Note (Signed)
Patient presenting for chronic dizziness.  Has history of anemia.  Endorses heavy periods.  Blood work in 08/2016 showed anemia.  Patient is not compliant with iron due to intolerance.  Suspect symptoms are related to iron deficiency anemia.  No signs or symptoms of cardiac or ongoing respiratory disease.   - Check CBC - Encourage patient restart iron supplements -Return in 2 weeks for follow-up symptoms

## 2018-09-07 NOTE — Progress Notes (Signed)
Subjective:    Brandy Foster is a 42 y.o. female who presents for evaluation of near syncope. Onset was 1 year ago. Symptoms have  gradually worsened since that time. Becoming more frequent. Patient describes the episode as never actually lost consciousness, had precursor symptoms only, including feeling of almost losing consciousness, severe lightheadedness. Associated symptoms: none. The patient denies abdominal pain, diarrhea, headache, heavy menstrual bleeding, melena and tachycardia/palpitations. Medications putting patient at risk for syncope: none. Patient endorses heavy menses. Regular cycle.  No intracycle bleeding. Period last 5 days. Patient uses 6 pads a day. Has a history of anemia. Frequently misses doses of her iron because it makes her feel sick. CBC in 08/2016 pt was anemic at 9.9. Dizziness when trying to stand up.   The following portions of the patient's history were reviewed and updated as appropriate: allergies, current medications, past family history, past medical history, past social history, past surgical history and problem list.  Review of Systems Denies Chest pain, no shortness of breath, no leg swelling, no headache or visual changes Objective:   Vitals:   09/07/18 1549  BP: 136/82  Temp: 98.2 F (36.8 C)  TempSrc: Oral  Weight: 285 lb (129.3 kg)   Gen: NAD, resting comfortably CV: RRR with no murmurs appreciated Pulm: NWOB, CTAB with no crackles, wheezes, or rhonchi GI: Normal bowel sounds present. Soft, Nontender, Nondistended. MSK: no edema, cyanosis, or clubbing noted Skin: warm, dry Neuro: grossly normal, moves all extremities Orthostatic VS for the past 24 hrs (Last 3 readings):  BP- Lying BP- Sitting BP- Standing at 0 minutes  09/07/18 1555 132/82 128/70 122/70   EKG: 03/2015 normal sinus rhythm Assessment and Plan:  Dizziness Patient presenting for chronic dizziness.  Has history of anemia.  Endorses heavy periods.  Blood work in 08/2016 showed  anemia.  Patient is not compliant with iron due to intolerance.  Suspect symptoms are related to iron deficiency anemia.  No signs or symptoms of cardiac or ongoing respiratory disease.   - Check CBC - Encourage patient restart iron supplements -Return in 2 weeks for follow-up symptoms

## 2018-09-07 NOTE — Patient Instructions (Addendum)
It was a pleasure to see you today! Thank you for choosing [facility] for your primary care. Brandy Foster was seen for dizziness.   1. We are going to check you for anemia today.   2. Restart taking your iron with food.   Best,  Marny Lowenstein, MD, MS FAMILY MEDICINE RESIDENT - PGY2 09/07/2018 4:24 PM

## 2018-09-08 ENCOUNTER — Telehealth: Payer: Self-pay | Admitting: *Deleted

## 2018-09-08 LAB — CBC WITH DIFFERENTIAL/PLATELET
Basophils Absolute: 0.1 10*3/uL (ref 0.0–0.2)
Basos: 1 %
EOS (ABSOLUTE): 0 10*3/uL (ref 0.0–0.4)
Eos: 0 %
Hematocrit: 26.4 % — ABNORMAL LOW (ref 34.0–46.6)
Hemoglobin: 7.7 g/dL — ABNORMAL LOW (ref 11.1–15.9)
Immature Grans (Abs): 0 10*3/uL (ref 0.0–0.1)
Immature Granulocytes: 0 %
Lymphocytes Absolute: 3 10*3/uL (ref 0.7–3.1)
Lymphs: 30 %
MCH: 21.2 pg — ABNORMAL LOW (ref 26.6–33.0)
MCHC: 29.2 g/dL — ABNORMAL LOW (ref 31.5–35.7)
MCV: 73 fL — ABNORMAL LOW (ref 79–97)
Monocytes Absolute: 0.7 10*3/uL (ref 0.1–0.9)
Monocytes: 7 %
Neutrophils Absolute: 6.2 10*3/uL (ref 1.4–7.0)
Neutrophils: 62 %
Platelets: 444 10*3/uL (ref 150–450)
RBC: 3.63 x10E6/uL — ABNORMAL LOW (ref 3.77–5.28)
RDW: 16.1 % — ABNORMAL HIGH (ref 12.3–15.4)
WBC: 10.1 10*3/uL (ref 3.4–10.8)

## 2018-09-08 NOTE — Telephone Encounter (Signed)
LM for patient to call back.  Will inform her of results and her need for additional labs when she calls.  Hiral Lukasiewicz,CMA

## 2018-09-08 NOTE — Progress Notes (Signed)
FYI, patient is very anemic. Likely due to iron deficiency. I am going to order an anemia panel. Pt is not compliant on iron. I recommended patient restart iron, but I would consider IV iron for her b/c she can't tolerate the nausea associated with it.

## 2018-09-08 NOTE — Addendum Note (Signed)
Addended by: Josephine Igo B on: 09/08/2018 10:42 AM   Modules accepted: Orders

## 2018-09-08 NOTE — Telephone Encounter (Signed)
-----   Message from Bonnita Hollow, MD sent at 09/08/2018 10:09 AM EDT ----- Patient has significant anemia. Please call patient to come to the clinic for a lab appointment. I will order an anemia panel on her.

## 2018-09-08 NOTE — Progress Notes (Signed)
Patient has significant anemia. Please call patient to come to the clinic for a lab appointment. I will order an anemia panel on her.

## 2018-09-09 ENCOUNTER — Encounter: Payer: Self-pay | Admitting: *Deleted

## 2018-09-09 NOTE — Telephone Encounter (Signed)
Patient has an appointment with Dr. Andy Gauss on 09/21/18.  Will mail her a letter asking her to come in for a lab visit prior.  Jazmin Hartsell,CMA

## 2018-09-15 ENCOUNTER — Other Ambulatory Visit: Payer: Medicare Other

## 2018-09-15 DIAGNOSIS — D508 Other iron deficiency anemias: Secondary | ICD-10-CM

## 2018-09-15 DIAGNOSIS — Z862 Personal history of diseases of the blood and blood-forming organs and certain disorders involving the immune mechanism: Secondary | ICD-10-CM

## 2018-09-16 MED ORDER — FERUMOXYTOL INJECTION 510 MG/17 ML: 510.0000 mg | Freq: Once | INTRAVENOUS | 0 refills | Status: DC

## 2018-09-16 NOTE — Progress Notes (Signed)
Pt has profound iron deficiency anemia. Will order IV iron and place referal to Hematology. Please call patient and inform them about this and tell them that I am ordering a dose of IV iron. Pt can will need to go to [facility]

## 2018-09-16 NOTE — Addendum Note (Signed)
Addended by: Josephine Igo B on: 09/16/2018 02:57 PM   Modules accepted: Orders

## 2018-09-16 NOTE — Addendum Note (Signed)
Addended by: Josephine Igo B on: 09/16/2018 11:15 AM   Modules accepted: Orders

## 2018-09-17 LAB — ANEMIA PANEL
Ferritin: 3 ng/mL — ABNORMAL LOW (ref 15–150)
Folate, Hemolysate: 361.6 ng/mL
Folate, RBC: 1310 ng/mL (ref >498)
Hematocrit: 27.6 % — ABNORMAL LOW (ref 34.0–46.6)
Iron Saturation: 5 % — CL (ref 15–55)
Iron: 20 ug/dL — ABNORMAL LOW (ref 27–159)
Retic Ct Pct: 1.8 % (ref 0.6–2.6)
Total Iron Binding Capacity: 434 ug/dL (ref 250–450)
UIBC: 414 ug/dL (ref 131–425)
Vitamin B-12: 329 pg/mL (ref 232–1245)

## 2018-09-21 ENCOUNTER — Ambulatory Visit (INDEPENDENT_AMBULATORY_CARE_PROVIDER_SITE_OTHER): Payer: Medicare Other | Admitting: Family Medicine

## 2018-09-21 ENCOUNTER — Encounter: Payer: Self-pay | Admitting: Family Medicine

## 2018-09-21 ENCOUNTER — Other Ambulatory Visit: Payer: Self-pay

## 2018-09-21 VITALS — BP 120/68 | HR 81 | Temp 98.9°F | Ht 68.0 in | Wt 279.0 lb

## 2018-09-21 DIAGNOSIS — R42 Dizziness and giddiness: Secondary | ICD-10-CM | POA: Diagnosis not present

## 2018-09-21 DIAGNOSIS — Z862 Personal history of diseases of the blood and blood-forming organs and certain disorders involving the immune mechanism: Secondary | ICD-10-CM | POA: Diagnosis not present

## 2018-09-21 MED ORDER — FERROUS SULFATE 325 (65 FE) MG PO TABS: 325.0000 mg | ORAL_TABLET | Freq: Two times a day (BID) | ORAL | 2 refills | Status: DC

## 2018-09-21 MED ORDER — POLYETHYLENE GLYCOL 3350 17 GM/SCOOP PO POWD: 17.0000 g | ORAL | 1 refills | Status: DC | PRN

## 2018-09-21 NOTE — Assessment & Plan Note (Signed)
Patient is following up for recent episodes of dizziness. Patient was found to be profoundly anemic with Hbg of 7.7 and Ferritin of 3 and low iron saturation consistent with Iron deficiency anemia in the settings of heavy menstrual bleeding. Suspicious this problem has been chronic and patient lack of adherence to supplementation has culminated with recent symptoms. --Recommend patient be adherent to Ferrous sulfate 325 mg bid. --Will recheck CBC in one month. --Miralax prescribed on a as needed basis.

## 2018-09-21 NOTE — Patient Instructions (Signed)
It was great seeing you today! We have addressed the following issues today  1. Your dizziness is improving but the reason why your are having these symptoms are related to your anemia (Low hemoglobin). I need to take two iron pills a day for the next three months to replenish your iron storage in your body.  2. Follow up in one month for repeat Hemoglobin 3. I prescribe a laxative to help you with possible constipation.  If we did any lab work today, and the results require attention, either me or my nurse will get in touch with you. If everything is normal, you will get a letter in mail and a message via . If you don't hear from [place] in two weeks, please give [place] a call. Otherwise, we look forward to seeing you again at your next visit. If you have any questions or concerns before then, please call the clinic at [phone].  Please bring all your medications to every doctors visit  Sign up for My Chart to have easy access to your labs results, and communication with your Primary care physician. Please ask Front Desk for some assistance.   Please check-out at the front desk before leaving the clinic.    Take Care,   Dr. Andy Gauss   Iron Deficiency Anemia, Adult Iron-deficiency anemia is when you have a low amount of red blood cells or hemoglobin. This happens because you have too little iron in your body. Hemoglobin carries oxygen to parts of the body. Anemia can cause your body to not get enough oxygen. It may or may not cause symptoms. Follow these instructions at home: Medicines  Take over-the-counter and prescription medicines only as told by your doctor. This includes iron pills (supplements) and vitamins.  If you cannot handle taking iron pills by mouth, ask your doctor about getting iron through: ? A vein (intravenously). ? A shot (injection) into a muscle.  Take iron pills when your stomach is empty. If you cannot handle this, take them with food.  Do not drink milk or take  antacids at the same time as your iron pills.  To prevent trouble pooping (constipation), eat fiber or take medicine (stool softener) as told by your doctor. Eating and drinking  Talk with your doctor before changing the foods you eat. He or she may tell you to eat foods that have a lot of iron, such as: ? Liver. ? Lowfat (lean) beef. ? Breads and cereals that have iron added to them (fortified breads and cereals). ? Eggs. ? Dried fruit. ? Dark green, leafy vegetables.  Drink enough fluid to keep your pee (urine) clear or pale yellow.  Eat fresh fruits and vegetables that are high in vitamin C. They help your body to use iron. Foods with a lot of vitamin C include: ? Oranges. ? Peppers. ? Tomatoes. ? Mangoes. General instructions  Return to your normal activities as told by your doctor. Ask your doctor what activities are safe for you.  Keep yourself clean, and keep things clean around you (your surroundings). Anemia can make you get sick more easily.  Keep all follow-up visits as told by your doctor. This is important. Contact a doctor if:  You feel sick to your stomach (nauseous).  You throw up (vomit).  You feel weak.  You are sweating for no clear reason.  You have trouble pooping, such as: ? Pooping (having a bowel movement) less than 3 times a week. ? Straining to poop. ? Having  poop that is hard, dry, or larger than normal. ? Feeling full or bloated. ? Pain in the lower belly. ? Not feeling better after pooping. Get help right away if:  You pass out (faint). If this happens, do not drive yourself to the hospital. Call your local emergency services (911 in the [place]  You have chest pain.  You have shortness of breath that: ? Is very bad. ? Gets worse with physical activity.  You have a fast heartbeat.  You get light-headed when getting up from sitting or lying down. This information is not intended to replace advice given to you by your health care  provider. Make sure you discuss any questions you have with your health care provider. Document Released: 01/09/2011 Document Revised: 08/26/2016 Document Reviewed: 08/26/2016 Elsevier Interactive Patient Education  2017 [employer].

## 2018-09-21 NOTE — Progress Notes (Signed)
   Subjective:    Patient ID: Brandy Foster, female    DOB: 1976/01/23, 42 y.o.   MRN: [id number]   CC: Follow up for dizziness  HPI: Patient is 42 yo female who presents today to follow up on dizziness. Patient was seen in clinic 9/18 for similar symptoms.  CBC showed a hemoglobin of 7.7.  Iron panel was ordered and showed ferritin of 3 with iron saturation of 5%.  Patient reports that she has been taking her iron pills intermittently. She reports some abdominal discomfort at times with supplementation. Patient does have a history of heavy menstrual cycle. She is currently only taking the iron pill once a day. She continue to endorse some mild dizziness and has the feeling that she will pass out. She currently denies any chest pain, abdominal, SOB, headaches or significant fatigue.   Smoking status reviewed   ROS: all other systems were reviewed and are negative other than in the HPI   Past Medical History:  Diagnosis Date  . Anemia   . Anxiety   . Depression   . Obesity     Past Surgical History:  Procedure Laterality Date  . CESAREAN SECTION      Past medical history, surgical, family, and social history reviewed and updated in the EMR as appropriate.  Objective:  BP 120/68   Pulse 81   Temp 98.9 F (37.2 C) (Oral)   Ht 5\' 8"  (1.727 m)   Wt 279 lb (126.6 kg)   LMP 08/30/2018 (Approximate)   SpO2 98%   BMI 42.42 kg/m   Vitals and nursing note reviewed  General: NAD, pleasant, able to participate in exam Cardiac: RRR, normal heart sounds, no murmurs. 2+ radial and PT pulses bilaterally Respiratory: CTAB, normal effort, No wheezes, rales or rhonchi Abdomen: soft, nontender, nondistended, no hepatic or splenomegaly, +BS Extremities: no edema or cyanosis. WWP. Skin: warm and dry, no rashes noted Neuro: alert and oriented x4, no focal deficits Psych: Normal affect and mood   Assessment & Plan:   Dizziness Patient is following up for recent episodes of dizziness.  Patient was found to be profoundly anemic with Hbg of 7.7 and Ferritin of 3 and low iron saturation consistent with Iron deficiency anemia in the settings of heavy menstrual bleeding. Suspicious this problem has been chronic and patient lack of adherence to supplementation has culminated with recent symptoms. --Recommend patient be adherent to Ferrous sulfate 325 mg bid. --Will recheck CBC in one month. --Miralax prescribed on a as needed basis.     Marjie Skiff, MD [place] PGY-3

## 2018-10-24 ENCOUNTER — Encounter: Payer: Self-pay | Admitting: Hematology and Oncology

## 2018-10-24 ENCOUNTER — Telehealth: Payer: Self-pay | Admitting: Hematology and Oncology

## 2018-10-24 NOTE — Telephone Encounter (Signed)
New referral received from Dr. Grandville Silos for hx of anemia and ida. Pt has been cld and scheduled to see Dr. Lindi Adie on 11/14 at 1pm. PT aware to arrive 30 minutes early. Letter mailed.

## 2018-10-25 ENCOUNTER — Telehealth: Payer: Self-pay | Admitting: Family Medicine

## 2018-10-25 NOTE — Telephone Encounter (Signed)
Spoke with pt reminding them of/confirming their appt for tomorrow 10/26/2018. -CH °

## 2018-10-26 ENCOUNTER — Encounter: Payer: Medicare Other | Admitting: Family Medicine

## 2018-10-26 NOTE — Progress Notes (Deleted)
   Subjective   Patient ID: TYWANDA RICE    DOB: 1976/09/05, 42 y.o. female   MRN: [id number]  CC: "***"  HPI: GEORGANN BRAMBLE is a 42 y.o. female who presents to clinic today for the following:  ***: ***  ***Never seen by me.  Here for a physical.  HM include flu, tetanus, Pap smear.  Significant history of anemia with last hemoglobin 7.7 75-month ago.  The IDA, referred to heme-onc.  ROS: see HPI for pertinent.  [place]: Severe obesity, IDA, recurrent dizziness, glaucoma.  Surgical history C-section.  Family history DM, COPD, HTN, schizophrenia.  Smoking status reviewed. Medications reviewed.  Objective   There were no vitals taken for this visit. Vitals and nursing note reviewed.  General: well nourished, well developed, NAD with non-toxic appearance HEENT: normocephalic, atraumatic, moist mucous membranes Neck: supple, non-tender without lymphadenopathy Cardiovascular: regular rate and rhythm without murmurs, rubs, or gallops Lungs: clear to auscultation bilaterally with normal work of breathing Abdomen: soft, non-tender, non-distended, normoactive bowel sounds Skin: warm, dry, no rashes or lesions, cap refill < 2 seconds Extremities: warm and well perfused, normal tone, no edema  Assessment & Plan   No problem-specific Assessment & Plan notes found for this encounter.  No orders of the defined types were placed in this encounter.  No orders of the defined types were placed in this encounter.   Harriet Butte, [place], PGY-3 10/26/2018, 9:38 AM

## 2018-11-03 ENCOUNTER — Inpatient Hospital Stay: Payer: Medicare Other | Attending: Hematology and Oncology | Admitting: Hematology and Oncology

## 2018-11-08 ENCOUNTER — Telehealth: Payer: Self-pay | Admitting: Hematology

## 2018-11-08 NOTE — Telephone Encounter (Signed)
Pt cld to r/s appt. Appt has been r/s for the pt to see Dr. Irene Limbo on 12/10 at 1pm.

## 2018-11-28 ENCOUNTER — Encounter: Payer: Medicare Other | Admitting: Family Medicine

## 2018-11-28 NOTE — Progress Notes (Deleted)
   Subjective   Patient ID: MYKENZI VANZILE    DOB: 1976/09/17, 42 y.o. female   MRN: [id number]  CC: "***"  HPI: GRICELDA FOLAND is a 42 y.o. female who presents to clinic today for the following:  ***: ***  ROS: see HPI for pertinent.  [place]: ***. Smoking status reviewed. Medications reviewed.  Objective   There were no vitals taken for this visit. Vitals and nursing note reviewed.  General: well nourished, well developed, NAD with non-toxic appearance HEENT: normocephalic, atraumatic, moist mucous membranes Neck: supple, non-tender without lymphadenopathy Cardiovascular: regular rate and rhythm without murmurs, rubs, or gallops Lungs: clear to auscultation bilaterally with normal work of breathing Abdomen: soft, non-tender, non-distended, normoactive bowel sounds Skin: warm, dry, no rashes or lesions, cap refill < 2 seconds Extremities: warm and well perfused, normal tone, no edema  Assessment & Plan   No problem-specific Assessment & Plan notes found for this encounter.  No orders of the defined types were placed in this encounter.  No orders of the defined types were placed in this encounter.   Harriet Butte, [place], PGY-3 11/28/2018, 1:38 PM

## 2018-11-29 ENCOUNTER — Telehealth: Payer: Self-pay

## 2018-11-29 ENCOUNTER — Inpatient Hospital Stay: Payer: Medicare Other | Attending: Hematology | Admitting: Hematology

## 2018-11-29 ENCOUNTER — Inpatient Hospital Stay: Payer: Medicare Other

## 2018-11-29 VITALS — BP 114/48 | HR 112 | Temp 98.3°F | Resp 16 | Ht 68.5 in | Wt 284.8 lb

## 2018-11-29 DIAGNOSIS — N920 Excessive and frequent menstruation with regular cycle: Secondary | ICD-10-CM | POA: Diagnosis not present

## 2018-11-29 DIAGNOSIS — E538 Deficiency of other specified B group vitamins: Secondary | ICD-10-CM

## 2018-11-29 DIAGNOSIS — D259 Leiomyoma of uterus, unspecified: Secondary | ICD-10-CM | POA: Insufficient documentation

## 2018-11-29 DIAGNOSIS — D500 Iron deficiency anemia secondary to blood loss (chronic): Secondary | ICD-10-CM | POA: Insufficient documentation

## 2018-11-29 DIAGNOSIS — D508 Other iron deficiency anemias: Secondary | ICD-10-CM | POA: Diagnosis not present

## 2018-11-29 LAB — CMP (CANCER CENTER ONLY)
ALT: <6 U/L (ref 0–44)
AST: 12 U/L — ABNORMAL LOW (ref 15–41)
Albumin: 3.4 g/dL — ABNORMAL LOW (ref 3.5–5.0)
Alkaline Phosphatase: 54 U/L (ref 38–126)
Anion gap: 6 (ref 5–15)
BUN: 8 mg/dL (ref 6–20)
CO2: 25 mmol/L (ref 22–32)
Calcium: 9 mg/dL (ref 8.9–10.3)
Chloride: 108 mmol/L (ref 98–111)
Creatinine: 0.88 mg/dL (ref 0.44–1.00)
GFR, Est AFR Am: >60 mL/min (ref >60)
GFR, Estimated: >60 mL/min (ref >60)
Glucose, Bld: 100 mg/dL — ABNORMAL HIGH (ref 70–99)
Potassium: 3.9 mmol/L (ref 3.5–5.1)
Sodium: 139 mmol/L (ref 135–145)
Total Bilirubin: 0.2 mg/dL — ABNORMAL LOW (ref 0.3–1.2)
Total Protein: 7.1 g/dL (ref 6.5–8.1)

## 2018-11-29 LAB — CBC WITH DIFFERENTIAL/PLATELET
Abs Immature Granulocytes: 0.03 10*3/uL (ref 0.00–0.07)
Basophils Absolute: 0.1 10*3/uL (ref 0.0–0.1)
Basophils Relative: 1 %
Eosinophils Absolute: 0.2 10*3/uL (ref 0.0–0.5)
Eosinophils Relative: 2 %
HCT: 33.3 % — ABNORMAL LOW (ref 36.0–46.0)
Hemoglobin: 9.4 g/dL — ABNORMAL LOW (ref 12.0–15.0)
Immature Granulocytes: 0 %
Lymphocytes Relative: 29 %
Lymphs Abs: 2.8 10*3/uL (ref 0.7–4.0)
MCH: 23.2 pg — ABNORMAL LOW (ref 26.0–34.0)
MCHC: 28.2 g/dL — ABNORMAL LOW (ref 30.0–36.0)
MCV: 82.2 fL (ref 80.0–100.0)
Monocytes Absolute: 0.6 10*3/uL (ref 0.1–1.0)
Monocytes Relative: 7 %
Neutro Abs: 6 10*3/uL (ref 1.7–7.7)
Neutrophils Relative %: 61 %
Platelets: 328 10*3/uL (ref 150–400)
RBC: 4.05 MIL/uL (ref 3.87–5.11)
RDW: 18.5 % — ABNORMAL HIGH (ref 11.5–15.5)
WBC: 9.7 10*3/uL (ref 4.0–10.5)
nRBC: 0 % (ref 0.0–0.2)

## 2018-11-29 LAB — SAMPLE TO BLOOD BANK

## 2018-11-29 LAB — IRON AND TIBC
Iron: 27 ug/dL — ABNORMAL LOW (ref 41–142)
Saturation Ratios: 6 % — ABNORMAL LOW (ref 21–57)
TIBC: 430 ug/dL (ref 236–444)
UIBC: 403 ug/dL — ABNORMAL HIGH (ref 120–384)

## 2018-11-29 LAB — FERRITIN: Ferritin: <4 ng/mL — ABNORMAL LOW (ref 11–307)

## 2018-11-29 LAB — VITAMIN B12: Vitamin B-12: 170 pg/mL — ABNORMAL LOW (ref 180–914)

## 2018-11-29 NOTE — Progress Notes (Signed)
HEMATOLOGY/ONCOLOGY CONSULTATION NOTE  Date of Service: 11/29/2018  Patient Care Team: [place] Bing, DO as PCP - General  CHIEF COMPLAINTS/PURPOSE OF CONSULTATION:  Iron deficiency anemia  HISTORY OF PRESENTING ILLNESS:   Brandy Foster is a wonderful 42 y.o. female who has been referred to [place] by Dr. Harriet Butte for evaluation and management of Iron deficiency anemia. The pt reports that she is doing well overall.   The pt notes that she currently feels tired, and denies any overt light headedness or dizziness.   However, the pt reports that she has had sensations that she is about to pass out for a few months, and that this was worse in the summertime. She notes that she hasn't felt as though she was going to pass out recently. She notes she has intermittently taken PO Iron replacement for several years. She is taking one pill of 325mg  Ferrous Sulfate a day, but forgets some days, and takes it with food, but notes that she has some abdominal discomfort with PO Iron replacement. The pt notes that she has ice cravings.  The pt notes that she has heavy periods, which has only been the case for the past few months. She notes that she uses two pads simultaneously for the first couple days and her periods last about 5 days. The pt notes that she last saw her OBGYN about a year ago. She notes that she had small fibroids 10 years ago, which she discovered during her second pregnancy. She has had three pregnancies, two successful pregnancies 10 and 21 years ago, and one miscarriage a "few years ago." She denies any recent surgeries, and denies any blood in the stools or other blood loss.   The pt denies any dietary restrictions. She denies taking any acid suppressant medication.   The pt notes that she has glaucoma and uses nightly eye drops. She denies any other medical problems.   Most recent lab results (09/07/18) of CBC w/diff is as follows: all values are WNL except for RBC at  3.63, HGB at 7.7, HCT at 26.4, MCV at 73, MCH at 21.2, MCHC at 29.2, RDW at 16.1. 09/15/18 Anemia Panel revealed all values WNL except for Iron at 20, Iron Saturation at 5%, HCT at 276, Ferritin at 3.   On review of systems, pt reports feeling tired, heavy periods, and denies blood in the stools, current light headedness or dizziness, black stools, abdominal pains, leg swelling, and any other symptoms.   On PMHx the pt reports two cesarean sections 21 and 10 years ago, Glaucoma. Denies liver problems.  On Family Hx the pt reports iron deficiency anemia and denies sickle cell disease, blood disorders   MEDICAL HISTORY:  Past Medical History:  Diagnosis Date  . Anemia   . Anxiety   . Depression   . Obesity     SURGICAL HISTORY: Past Surgical History:  Procedure Laterality Date  . CESAREAN SECTION      SOCIAL HISTORY: Social History   Socioeconomic History  . Marital status: Single    Spouse name: Not on file  . Number of children: Not on file  . Years of education: Not on file  . Highest education level: Not on file  Occupational History  . Not on file  Social Needs  . Financial resource strain: Not on file  . Food insecurity:    Worry: Not on file    Inability: Not on file  . Transportation needs:    Medical:  Not on file    Non-medical: Not on file  Tobacco Use  . Smoking status: Never Smoker  . Smokeless tobacco: Never Used  Substance and Sexual Activity  . Alcohol use: No  . Drug use: No  . Sexual activity: Yes    Birth control/protection: None  Lifestyle  . Physical activity:    Days per week: Not on file    Minutes per session: Not on file  . Stress: Not on file  Relationships  . Social connections:    Talks on phone: Not on file    Gets together: Not on file    Attends religious service: Not on file    Active member of club or organization: Not on file    Attends meetings of clubs or organizations: Not on file    Relationship status: Not on file  .  Intimate partner violence:    Fear of current or ex partner: Not on file    Emotionally abused: Not on file    Physically abused: Not on file    Forced sexual activity: Not on file  Other Topics Concern  . Not on file  Social History Narrative   On disability- not sure what her disability is- for mental  Health reasons.  Unsure of diagnosis.     Mother gets disability check and gives pt the money. - mom pays bills and then gives her the rest.    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    Lives at rental home- has 2 children (ages 25 (melanie) and 71 (jabaree))             FAMILY HISTORY: Family History  Problem Relation Age of Onset  . Diabetes Mother   . COPD Mother   . Arthritis Mother   . Hypertension Mother   . Hyperlipidemia Father   . Schizophrenia Brother     ALLERGIES:  has No Known Allergies.  MEDICATIONS:  Current Outpatient Medications  Medication Sig Dispense Refill  . ferrous sulfate 325 (65 FE) MG tablet Take 1 tablet (325 mg total) by mouth 2 (two) times daily with a meal. 60 tablet 2  . ferumoxytol (FERAHEME) 510 MG/17ML SOLN injection Inject 17 mLs (510 mg total) into the vein once for 1 dose. 17 mL 0  . LATANOPROST OP Place 1 drop into both eyes at bedtime.    . naproxen (NAPROSYN) 500 MG tablet Take 1 tablet (500 mg total) by mouth 2 (two) times daily. 20 tablet 0  . polyethylene glycol powder (GLYCOLAX/MIRALAX) powder Take 17 g by mouth as needed for mild constipation or moderate constipation. 3350 g 1   No current facility-administered medications for this visit.     REVIEW OF SYSTEMS:    10 Point review of Systems was done is negative except as noted above.  PHYSICAL EXAMINATION:  . Vitals:   11/29/18 1319  BP: (!) 114/48  Pulse: (!) 112  Resp: 16  Temp: 98.3 F (36.8 C)  SpO2: 100%   Filed Weights   11/29/18 1319  Weight: 284 lb 12.8 oz (129.2 kg)     .Body mass index is 42.67 kg/m.  GENERAL:alert, in no acute distress and comfortable SKIN: no acute rashes, no significant lesions EYES: conjunctiva are pink and non-injected, sclera anicteric OROPHARYNX: MMM, no exudates, no oropharyngeal erythema or ulceration NECK: supple, no JVD LYMPH:  no palpable lymphadenopathy in the cervical, axillary or inguinal regions  LUNGS: clear to auscultation b/l with normal respiratory effort HEART: regular rate & rhythm ABDOMEN:  normoactive bowel sounds , non tender, not distended. Extremity: no pedal edema PSYCH: alert & oriented x 3 with fluent speech NEURO: no focal motor/sensory deficits  LABORATORY DATA:  I have reviewed the data as listed  . CBC Latest Ref Rng & Units 11/29/2018 11/29/2018 09/15/2018  WBC 4.0 - 10.5 K/uL 9.7 - -  Hemoglobin 12.0 - 15.0 g/dL 9.4(L) - -  Hematocrit 34.0 - 46.6 % 31.3(L) 33.3(L) 27.6(L)  Platelets 150 - 400 K/uL 328 - -    . CMP Latest Ref Rng & Units 11/29/2018 09/01/2016 04/17/2015  Glucose 70 - 99 mg/dL 100(H) 108(H) 81  BUN 6 - 20 mg/dL 8 8 9   Creatinine 0.44 - 1.00 mg/dL 0.88 0.83 0.67  Sodium 135 - 145 mmol/L 139 140 138  Potassium 3.5 - 5.1 mmol/L 3.9 4.3 4.2  Chloride 98 - 111 mmol/L 108 108 107  CO2 22 - 32 mmol/L 25 23 24   Calcium 8.9 - 10.3 mg/dL 9.0 9.1 8.6  Total Protein 6.5 - 8.1 g/dL 7.1 - -  Total Bilirubin 0.3 - 1.2 mg/dL 0.2(L) - -  Alkaline Phos 38 - 126 U/L 54 - -  AST 15 - 41 U/L 12(L) - -  ALT 0 - 44 U/L <6 - -   . Lab Results  Component Value Date   IRON 27 (L) 11/29/2018   TIBC 430 11/29/2018   IRONPCTSAT 6 (L) 11/29/2018   (Iron and TIBC)  Lab Results  Component Value Date   FERRITIN <4 (L) 11/29/2018      RADIOGRAPHIC STUDIES: I have personally reviewed the radiological images as listed and agreed with the findings in the report. No results found.  ASSESSMENT & PLAN:   42 y.o. female with  1. Severe Iron deficiency anemia - related to  menorrhagia PLAN -Discussed patient's most recent labs from 09/07/18 CBC w/diff which revealed WBC normal at 10.1k, PLT normal at 444k, anemic with HGB at 7.7. 09/15/18  -today Anemia Panel revealed 6% Iron Saturation, and a Ferritin of <4 -Patient is significantly iron deficient, likely related to her menorrhagia -Advised that the pt follow up with OBGYN for further evaluation of her menorrhagia and history of fibroids -Offered to replace iron IV as opposed to PO, since the po iron insnt correcting her iron deficiency in the setting of significant ongoing menorrhagia. -Will order IV Injectafer x weekly for 2 doses. Discussed risks vs benefits of IV iron and informed consent obtained. -Recommend that the pt begin taking a Vitamin B complex OTC -Will order labs today -Will see the pt back in 8 weeks    Labs today IV Injectafer weekly x 2 doses ASAP RTC with Dr Irene Limbo in 8 weeks with labs   All of the patients questions were answered with apparent satisfaction. The patient knows to call the clinic with any problems, questions or concerns.  The total time spent in the appt was 45 minutes and more than 50% was on counseling and direct patient cares.    Sullivan Lone MD MS AAHIVMS [facility] Hematology/Oncology [facility]  (Office):       [phone] (Work cell):  [phone] (Fax):           [phone]  11/29/2018 1:55 PM  I, Baldwin [place], am acting as a scribe for Dr. Sullivan Lone.   .I have reviewed the above documentation for accuracy and completeness, and I  agree with the above. Brunetta Genera MD

## 2018-11-29 NOTE — Telephone Encounter (Signed)
Priiinted avs and calender of upcoming appointment. Per 12/10 los

## 2018-11-30 ENCOUNTER — Telehealth: Payer: Self-pay | Admitting: Hematology

## 2018-11-30 LAB — FOLATE RBC
Folate, Hemolysate: 368.7 ng/mL
Folate, RBC: 1178 ng/mL (ref >498)
Hematocrit: 31.3 % — ABNORMAL LOW (ref 34.0–46.6)

## 2018-11-30 NOTE — Telephone Encounter (Signed)
Per 12/10 los already done °

## 2018-12-05 ENCOUNTER — Other Ambulatory Visit: Payer: Self-pay | Admitting: Hematology

## 2018-12-05 ENCOUNTER — Ambulatory Visit (HOSPITAL_COMMUNITY)
Admission: RE | Admit: 2018-12-05 | Discharge: 2018-12-05 | Disposition: A | Discharge status: Home / Self Care | Payer: Medicare Other | Source: Ambulatory Visit | Attending: Hematology | Admitting: Hematology

## 2018-12-05 DIAGNOSIS — D500 Iron deficiency anemia secondary to blood loss (chronic): Secondary | ICD-10-CM

## 2018-12-05 DIAGNOSIS — D509 Iron deficiency anemia, unspecified: Secondary | ICD-10-CM | POA: Diagnosis not present

## 2018-12-05 MED ORDER — SODIUM CHLORIDE 0.9 % IV SOLN
INTRAVENOUS | Status: DC | PRN
  Administered 2018-12-05: 250 mL via INTRAVENOUS

## 2018-12-05 MED ORDER — SODIUM CHLORIDE 0.9 % IV SOLN: 750.0000 mg | INTRAVENOUS | Status: DC

## 2018-12-05 MED ORDER — SODIUM CHLORIDE 0.9 % IV SOLN
750.0000 mg | Freq: Once | INTRAVENOUS | Status: AC
  Administered 2018-12-05: 750 mg via INTRAVENOUS
  Filled 2018-12-05: qty 15

## 2018-12-05 NOTE — Discharge Instructions (Signed)
Ferric carboxymaltose injection What is this medicine? FERRIC CARBOXYMALTOSE (ferr-ik car-box-ee-mol-toes) is an iron complex. Iron is used to make healthy red blood cells, which carry oxygen and nutrients throughout the body. This medicine is used to treat anemia in people with chronic kidney disease or people who cannot take iron by mouth. This medicine may be used for other purposes; ask your health care provider or pharmacist if you have questions. COMMON BRAND NAME(S): Injectafer What should I tell my health care provider before I take this medicine? They need to know if you have any of these conditions: -anemia not caused by low iron levels -high levels of iron in the blood -liver disease -an unusual or allergic reaction to iron, other medicines, foods, dyes, or preservatives -pregnant or trying to get pregnant -breast-feeding How should I use this medicine? This medicine is for infusion into a vein. It is given by a health care professional in a hospital or clinic setting. Talk to your pediatrician regarding the use of this medicine in children. Special care may be needed. Overdosage: If you think you have taken too much of this medicine contact a poison control center or emergency room at once. NOTE: This medicine is only for you. Do not share this medicine with others. What if I miss a dose? It is important not to miss your dose. Call your doctor or health care professional if you are unable to keep an appointment. What may interact with this medicine? Do not take this medicine with any of the following medications: -deferoxamine -dimercaprol -other iron products This medicine may also interact with the following medications: -chloramphenicol -deferasirox This list may not describe all possible interactions. Give your health care provider a list of all the medicines, herbs, non-prescription drugs, or dietary supplements you use. Also tell them if you smoke, drink alcohol, or use  illegal drugs. Some items may interact with your medicine. What should I watch for while using this medicine? Visit your doctor or health care professional regularly. Tell your doctor if your symptoms do not start to get better or if they get worse. You may need blood work done while you are taking this medicine. You may need to follow a special diet. Talk to your doctor. Foods that contain iron include: whole grains/cereals, dried fruits, beans, or peas, leafy green vegetables, and organ meats (liver, kidney). What side effects may I notice from receiving this medicine? Side effects that you should report to your doctor or health care professional as soon as possible: -allergic reactions like skin rash, itching or hives, swelling of the face, lips, or tongue -breathing problems -changes in blood pressure -feeling faint or lightheaded, falls -flushing, sweating, or hot feelings Side effects that usually do not require medical attention (report to your doctor or health care professional if they continue or are bothersome): -changes in taste -constipation -dizziness -headache -nausea -pain, redness, or irritation at site where injected -vomiting This list may not describe all possible side effects. Call your doctor for medical advice about side effects. You may report side effects to FDA at 1-800-FDA-1088. Where should I keep my medicine? This drug is given in a hospital or clinic and will not be stored at home. NOTE: This sheet is a summary. It may not cover all possible information. If you have questions about this medicine, talk to your doctor, pharmacist, or health care provider.  2018 Elsevier/Gold Standard (2016-01-09 11:20:47)  

## 2018-12-05 NOTE — Progress Notes (Signed)
PATIENT CARE CENTER NOTE  Diagnosis: Iron Deficiency Anemia    Provider: Dr. Irene Limbo   Procedure: IV Injectafer    Note: Patient received infusion of Injectafer. Tolerated infusion well with no adverse. Monitored patient for 30 minutes post-infusion. Vital signs stable. Discharge instructions given to patient. Patient alert, oriented and ambulatory at discharge.

## 2018-12-12 ENCOUNTER — Ambulatory Visit (HOSPITAL_COMMUNITY)
Admission: RE | Admit: 2018-12-12 | Discharge: 2018-12-12 | Disposition: A | Discharge status: Home / Self Care | Payer: Medicare Other | Source: Ambulatory Visit | Attending: Hematology | Admitting: Hematology

## 2018-12-12 DIAGNOSIS — D509 Iron deficiency anemia, unspecified: Secondary | ICD-10-CM | POA: Diagnosis not present

## 2018-12-12 MED ORDER — SODIUM CHLORIDE 0.9 % IV SOLN
INTRAVENOUS | Status: DC | PRN
  Administered 2018-12-12: 250 mL via INTRAVENOUS

## 2018-12-12 MED ORDER — SODIUM CHLORIDE 0.9 % IV SOLN
750.0000 mg | Freq: Once | INTRAVENOUS | Status: AC
  Administered 2018-12-12: 750 mg via INTRAVENOUS
  Filled 2018-12-12: qty 15

## 2018-12-12 NOTE — Discharge Instructions (Signed)

## 2018-12-12 NOTE — Progress Notes (Signed)
PATIENT CARE CENTER NOTE  Diagnosis: Iron Deficiency Anemia    Provider: Dr. Irene Limbo   Procedure: IV Injectafer    Note: Patient received second dose of Injectafer. Tolerated infusion well with no adverse reaction. Observed for 30 minutes post-infusion. Vitals signs stable. Discharge instructions given. Patient alert, oriented and ambulatory at discharge.

## 2019-01-24 ENCOUNTER — Inpatient Hospital Stay: Payer: Medicare Other

## 2019-01-24 ENCOUNTER — Telehealth: Payer: Self-pay | Admitting: Hematology

## 2019-01-24 ENCOUNTER — Inpatient Hospital Stay: Payer: Medicare Other | Admitting: Hematology

## 2019-01-24 NOTE — Telephone Encounter (Signed)
Patient came in late for lab/fu. Patient wanted to have lab today and see GK tomorrow, however patient has conflicting doctor's appointment tomorrow and will have both lab/fu 2/13. Patient given updated calendar.

## 2019-01-25 ENCOUNTER — Encounter: Payer: Medicare Other | Admitting: Family Medicine

## 2019-01-25 ENCOUNTER — Encounter: Payer: Self-pay | Admitting: Family Medicine

## 2019-01-25 NOTE — Progress Notes (Deleted)
   Subjective   Patient ID: Brandy Foster    DOB: [birth year], 43 y.o. female   MRN: [id number]  CC: "***"  HPI: Brandy Foster is a 43 y.o. female who presents to clinic today for the following:  ***: ***  ***Never seen by me. Need flu, tdap, and PAP.  ROS: see HPI for pertinent.  [place]: ***. Smoking status reviewed. Medications reviewed.  Objective   There were no vitals taken for this visit. Vitals and nursing note reviewed.  General: well nourished, well developed, NAD with non-toxic appearance HEENT: normocephalic, atraumatic, moist mucous membranes Neck: supple, non-tender without lymphadenopathy Cardiovascular: regular rate and rhythm without murmurs, rubs, or gallops Lungs: clear to auscultation bilaterally with normal work of breathing Abdomen: soft, non-tender, non-distended, normoactive bowel sounds Skin: warm, dry, no rashes or lesions, cap refill < 2 seconds Extremities: warm and well perfused, normal tone, no edema  Assessment & Plan   No problem-specific Assessment & Plan notes found for this encounter.  No orders of the defined types were placed in this encounter.  No orders of the defined types were placed in this encounter.   Harriet Butte, [place], PGY-3 01/25/2019, 7:38 AM

## 2019-02-01 NOTE — Progress Notes (Signed)
HEMATOLOGY/ONCOLOGY CONSULTATION NOTE  Date of Service: 02/02/2019  Patient Care Team: [place] Bing, DO as PCP - General  CHIEF COMPLAINTS/PURPOSE OF CONSULTATION:  Iron deficiency anemia  HISTORY OF PRESENTING ILLNESS:   Brandy Foster is a wonderful 43 y.o. female who has been referred to [place] by Dr. Harriet Butte for evaluation and management of Iron deficiency anemia. The pt reports that she is doing well overall.   The pt notes that she currently feels tired, and denies any overt light headedness or dizziness.   However, the pt reports that she has had sensations that she is about to pass out for a few months, and that this was worse in the summertime. She notes that she hasn't felt as though she was going to pass out recently. She notes she has intermittently taken PO Iron replacement for several years. She is taking one pill of 325mg  Ferrous Sulfate a day, but forgets some days, and takes it with food, but notes that she has some abdominal discomfort with PO Iron replacement. The pt notes that she has ice cravings.  The pt notes that she has heavy periods, which has only been the case for the past few months. She notes that she uses two pads simultaneously for the first couple days and her periods last about 5 days. The pt notes that she last saw her OBGYN about a year ago. She notes that she had small fibroids 10 years ago, which she discovered during her second pregnancy. She has had three pregnancies, two successful pregnancies 10 and 21 years ago, and one miscarriage a "few years ago." She denies any recent surgeries, and denies any blood in the stools or other blood loss.   The pt denies any dietary restrictions. She denies taking any acid suppressant medication.   The pt notes that she has glaucoma and uses nightly eye drops. She denies any other medical problems.   Most recent lab results (09/07/18) of CBC w/diff is as follows: all values are WNL except for RBC at 3.63,  HGB at 7.7, HCT at 26.4, MCV at 73, MCH at 21.2, MCHC at 29.2, RDW at 16.1. 09/15/18 Anemia Panel revealed all values WNL except for Iron at 20, Iron Saturation at 5%, HCT at 276, Ferritin at 3.   On review of systems, pt reports feeling tired, heavy periods, and denies blood in the stools, current light headedness or dizziness, black stools, abdominal pains, leg swelling, and any other symptoms.   On PMHx the pt reports two cesarean sections 21 and 10 years ago, Glaucoma. Denies liver problems.  On Family Hx the pt reports iron deficiency anemia and denies sickle cell disease, blood disorders  Interval History:   Brandy Foster returns today for management and evaluation of her iron deficiency anemia. The patient's last visit with [place] was on 11/29/18. The pt reports that she is doing well overall.   In the interim the pt received two doses of weekly IV Injectafer, her last on 12/12/18. The pt reports that she has been enjoying much improved energy levels. She notes that she has been tolerating Ferrous sulfate once a day without complaint. She notes that she has also not seen her OBGYN since our last visit and intends to schedule an appointment. She notes that her periods last 5 days and the first 2-3 days are heavy.   Lab results today (02/02/19) of CBC w/diff is as follows: all values are WNL except for HGB at 11.3, RDW  at 20.2. 02/02/19 Ferritin is 74 02/02/19 Iron & TIBC is 17%  On review of systems, pt reports much improved energy levels, heavy periods, and denies constipation, abdominal pains, leg swelling, and any other symptoms.   MEDICAL HISTORY:  Past Medical History:  Diagnosis Date  . Anemia   . Anxiety   . Depression   . Obesity     SURGICAL HISTORY: Past Surgical History:  Procedure Laterality Date  . CESAREAN SECTION      SOCIAL HISTORY: Social History   Socioeconomic History  . Marital status: Single    Spouse name: Not on file  . Number of children: Not on file    . Years of education: Not on file  . Highest education level: Not on file  Occupational History  . Not on file  Social Needs  . Financial resource strain: Not on file  . Food insecurity:    Worry: Not on file    Inability: Not on file  . Transportation needs:    Medical: Not on file    Non-medical: Not on file  Tobacco Use  . Smoking status: Never Smoker  . Smokeless tobacco: Never Used  Substance and Sexual Activity  . Alcohol use: No  . Drug use: No  . Sexual activity: Yes    Birth control/protection: None  Lifestyle  . Physical activity:    Days per week: Not on file    Minutes per session: Not on file  . Stress: Not on file  Relationships  . Social connections:    Talks on phone: Not on file    Gets together: Not on file    Attends religious service: Not on file    Active member of club or organization: Not on file    Attends meetings of clubs or organizations: Not on file    Relationship status: Not on file  . Intimate partner violence:    Fear of current or ex partner: Not on file    Emotionally abused: Not on file    Physically abused: Not on file    Forced sexual activity: Not on file  Other Topics Concern  . Not on file  Social History Narrative   On disability- not sure what her disability is- for mental  Health reasons.  Unsure of diagnosis.     Mother gets disability check and gives pt the money. - mom pays bills and then gives her the rest.    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    Lives at rental home- has 2 children (ages 59 (melanie) and 33 (jabaree))             FAMILY HISTORY: Family History  Problem Relation Age of Onset  . Diabetes Mother   . COPD Mother   . Arthritis Mother   . Hypertension Mother   . Hyperlipidemia Father   . Schizophrenia Brother     ALLERGIES:  has No Known Allergies.  MEDICATIONS:  Current Outpatient Medications   Medication Sig Dispense Refill  . ferrous sulfate 325 (65 FE) MG tablet Take 1 tablet (325 mg total) by mouth 2 (two) times daily with a meal. 60 tablet 2  . ferumoxytol (FERAHEME) 510 MG/17ML SOLN injection Inject 17 mLs (510 mg total) into the vein once for 1 dose. 17 mL 0  . LATANOPROST OP Place 1 drop into both eyes at bedtime.    . naproxen (NAPROSYN) 500  MG tablet Take 1 tablet (500 mg total) by mouth 2 (two) times daily. 20 tablet 0  . polyethylene glycol powder (GLYCOLAX/MIRALAX) powder Take 17 g by mouth as needed for mild constipation or moderate constipation. 3350 g 1   No current facility-administered medications for this visit.     REVIEW OF SYSTEMS:    A 10+ POINT REVIEW OF SYSTEMS WAS OBTAINED including neurology, dermatology, psychiatry, cardiac, respiratory, lymph, extremities, GI, GU, Musculoskeletal, constitutional, breasts, reproductive, HEENT.  All pertinent positives are noted in the HPI.  All others are negative.   PHYSICAL EXAMINATION:  . Vitals:   02/02/19 1102  BP: (!) 142/86  Pulse: 89  Resp: 18  Temp: 98.9 F (37.2 C)  SpO2: 99%   Filed Weights   02/02/19 1102  Weight: 284 lb 3.2 oz (128.9 kg)   .Body mass index is 42.58 kg/m.  GENERAL:alert, in no acute distress and comfortable SKIN: no acute rashes, no significant lesions EYES: conjunctiva are pink and non-injected, sclera anicteric OROPHARYNX: MMM, no exudates, no oropharyngeal erythema or ulceration NECK: supple, no JVD LYMPH:  no palpable lymphadenopathy in the cervical, axillary or inguinal regions LUNGS: clear to auscultation b/l with normal respiratory effort HEART: regular rate & rhythm ABDOMEN:  normoactive bowel sounds , non tender, not distended. No palpable hepatosplenomegaly.  Extremity: no pedal edema PSYCH: alert & oriented x 3 with fluent speech NEURO: no focal motor/sensory deficits    LABORATORY DATA:  I have reviewed the data as listed  . CBC Latest Ref Rng & Units  02/02/2019 11/29/2018 11/29/2018  WBC 4.0 - 10.5 K/uL 7.1 9.7 -  Hemoglobin 12.0 - 15.0 g/dL 11.3(L) 9.4(L) -  Hematocrit 36.0 - 46.0 % 37.3 31.3(L) 33.3(L)  Platelets 150 - 400 K/uL 251 328 -    . CMP Latest Ref Rng & Units 11/29/2018 09/01/2016 04/17/2015  Glucose 70 - 99 mg/dL 100(H) 108(H) 81  BUN 6 - 20 mg/dL 8 8 9   Creatinine 0.44 - 1.00 mg/dL 0.88 0.83 0.67  Sodium 135 - 145 mmol/L 139 140 138  Potassium 3.5 - 5.1 mmol/L 3.9 4.3 4.2  Chloride 98 - 111 mmol/L 108 108 107  CO2 22 - 32 mmol/L 25 23 24   Calcium 8.9 - 10.3 mg/dL 9.0 9.1 8.6  Total Protein 6.5 - 8.1 g/dL 7.1 - -  Total Bilirubin 0.3 - 1.2 mg/dL 0.2(L) - -  Alkaline Phos 38 - 126 U/L 54 - -  AST 15 - 41 U/L 12(L) - -  ALT 0 - 44 U/L <6 - -   . Lab Results  Component Value Date   IRON 27 (L) 11/29/2018   TIBC 430 11/29/2018   IRONPCTSAT 6 (L) 11/29/2018   (Iron and TIBC)  Lab Results  Component Value Date   FERRITIN <4 (L) 11/29/2018   . Lab Results  Component Value Date   IRON 52 02/02/2019   TIBC 301 02/02/2019   IRONPCTSAT 17 (L) 02/02/2019   (Iron and TIBC)  Lab Results  Component Value Date   FERRITIN 74 02/02/2019      RADIOGRAPHIC STUDIES: I have personally reviewed the radiological images as listed and agreed with the findings in the report. No results found.  ASSESSMENT & PLAN:   43 y.o. female with  1. Severe Iron deficiency anemia - related to menorrhagia PLAN -Discussed pt labwork today, 02/02/19; HGB improved to 11.3 from 9.4 -02/02/19 Ferritin is improved from <4 to 74 -Continue with Ferrous sulfate 1 tab po daily for  maintenance iron replacement -Again advised that the pt follow up with OBGYN for further evaluation of her menorrhagia and history of fibroids -Recommend that the pt begin taking a Vitamin B complex OTC -Will see the pt back in 6 months   RTC with Dr Irene Limbo in 6 months with labs   All of the patients questions were answered with apparent satisfaction. The  patient knows to call the clinic with any problems, questions or concerns.  The total time spent in the appt was 20 minutes and more than 50% was on counseling and direct patient cares.    Sullivan Lone MD MS AAHIVMS [facility] Hematology/Oncology Physician [facility]  (Office):       [phone] (Work cell):  [phone] (Fax):           [phone]  02/02/2019 11:28 AM  I, [place], am acting as a scribe for Dr. Sullivan Lone.   .I have reviewed the above documentation for accuracy and completeness, and I agree with the above. Brunetta Genera MD

## 2019-02-02 ENCOUNTER — Inpatient Hospital Stay: Payer: Medicare Other | Attending: Hematology | Admitting: Hematology

## 2019-02-02 ENCOUNTER — Inpatient Hospital Stay: Payer: Medicare Other

## 2019-02-02 VITALS — BP 142/86 | HR 89 | Temp 98.9°F | Resp 18 | Ht 68.5 in | Wt 284.2 lb

## 2019-02-02 DIAGNOSIS — D508 Other iron deficiency anemias: Secondary | ICD-10-CM

## 2019-02-02 DIAGNOSIS — N920 Excessive and frequent menstruation with regular cycle: Secondary | ICD-10-CM

## 2019-02-02 DIAGNOSIS — D500 Iron deficiency anemia secondary to blood loss (chronic): Secondary | ICD-10-CM | POA: Diagnosis not present

## 2019-02-02 DIAGNOSIS — D259 Leiomyoma of uterus, unspecified: Secondary | ICD-10-CM

## 2019-02-02 DIAGNOSIS — Z79899 Other long term (current) drug therapy: Secondary | ICD-10-CM | POA: Insufficient documentation

## 2019-02-02 DIAGNOSIS — Z791 Long term (current) use of non-steroidal anti-inflammatories (NSAID): Secondary | ICD-10-CM | POA: Insufficient documentation

## 2019-02-02 LAB — CBC WITH DIFFERENTIAL/PLATELET
Abs Immature Granulocytes: 0.02 10*3/uL (ref 0.00–0.07)
Basophils Absolute: 0 10*3/uL (ref 0.0–0.1)
Basophils Relative: 1 %
Eosinophils Absolute: 0.1 10*3/uL (ref 0.0–0.5)
Eosinophils Relative: 2 %
HCT: 37.3 % (ref 36.0–46.0)
Hemoglobin: 11.3 g/dL — ABNORMAL LOW (ref 12.0–15.0)
Immature Granulocytes: 0 %
Lymphocytes Relative: 28 %
Lymphs Abs: 2 10*3/uL (ref 0.7–4.0)
MCH: 29.2 pg (ref 26.0–34.0)
MCHC: 30.3 g/dL (ref 30.0–36.0)
MCV: 96.4 fL (ref 80.0–100.0)
Monocytes Absolute: 0.4 10*3/uL (ref 0.1–1.0)
Monocytes Relative: 6 %
Neutro Abs: 4.5 10*3/uL (ref 1.7–7.7)
Neutrophils Relative %: 63 %
Platelets: 251 10*3/uL (ref 150–400)
RBC: 3.87 MIL/uL (ref 3.87–5.11)
RDW: 20.2 % — ABNORMAL HIGH (ref 11.5–15.5)
WBC: 7.1 10*3/uL (ref 4.0–10.5)
nRBC: 0 % (ref 0.0–0.2)

## 2019-02-02 LAB — IRON AND TIBC
Iron: 52 ug/dL (ref 41–142)
Saturation Ratios: 17 % — ABNORMAL LOW (ref 21–57)
TIBC: 301 ug/dL (ref 236–444)
UIBC: 249 ug/dL (ref 120–384)

## 2019-02-02 LAB — FERRITIN: Ferritin: 74 ng/mL (ref 11–307)

## 2019-02-03 ENCOUNTER — Telehealth: Payer: Self-pay | Admitting: Hematology

## 2019-02-03 NOTE — Telephone Encounter (Signed)
Called patient and scheduled appt per 2/13 los.  Patient aware of appt date and time.

## 2019-06-20 ENCOUNTER — Telehealth: Payer: Self-pay | Admitting: *Deleted

## 2019-06-20 ENCOUNTER — Telehealth (INDEPENDENT_AMBULATORY_CARE_PROVIDER_SITE_OTHER): Payer: Medicare Other | Admitting: Family Medicine

## 2019-06-20 ENCOUNTER — Other Ambulatory Visit: Payer: Self-pay

## 2019-06-20 DIAGNOSIS — M791 Myalgia, unspecified site: Secondary | ICD-10-CM

## 2019-06-20 DIAGNOSIS — Z20822 Contact with and (suspected) exposure to covid-19: Secondary | ICD-10-CM

## 2019-06-20 DIAGNOSIS — R438 Other disturbances of smell and taste: Secondary | ICD-10-CM

## 2019-06-20 DIAGNOSIS — Z20828 Contact with and (suspected) exposure to other viral communicable diseases: Secondary | ICD-10-CM

## 2019-06-20 DIAGNOSIS — R05 Cough: Secondary | ICD-10-CM | POA: Diagnosis not present

## 2019-06-20 NOTE — Telephone Encounter (Signed)
-----   Message from [place] Shirley, DO sent at 06/20/2019  2:39 PM EDT ----- COVID Drive-Up Test Referral Criteria  Patient age: 43 y.o.  Symptoms: Cough, Shortness of breath, Muscle pain, New loss of smell or taste, and Sore throat  Underlying Conditions: No underlying conditions  Is the patient a first responder? No  Does the patient live or work in a high risk or high density environment: No  Is the patient a COVID convalescent patient who is 14-28 days symptom-free and interested in donating plasma for use as a therapeutic product? No  Please discuss with patient if she is appropriate for testing.

## 2019-06-20 NOTE — Progress Notes (Signed)
[place] Telemedicine Visit  Patient consented to have virtual visit. Method of visit: Video  Encounter participants: Patient: Brandy Foster - located at home Provider: [place] Quynn Vilchis - located at home Others (if applicable): n/a  Chief Complaint: cough, loss of taste and smell, body aches  HPI: Patient says for the past week she has had cough, off and on headache. Denies any fevers or chills. Lost of sense of taste and smell, all started at once last week. Denies any shortness of breath- she went outside yesterday and was walking, but felt short of breath. She attributed her SOB to the heat.   Patient has not had any sick contacts. She has been socially distancing at home and only going to the grocery store. Daughter and son have been staying in their bedroom. No swelling in legs or feet, no chest pain. She does endorse some fatigue and body aches. No hx of lung disease and does not take medications daily except for iron supplementation.   ROS: per HPI  Pertinent PMHx: so significant PMH  Exam:  Respiratory: patient speaking in complete sentences and appear comfortable  Assessment/Plan:  Sx c/w COVID 19 No recent known contacts with COVID-19. She reports cough, lost of smell, taste and fatigue. Would be reasonable to test given symptoms.   Staff message sent for COVID-19 drive-up testing for patient to discuss if they think testing is reasonable, given that she is social distancing and not high risk.  Counseled on wearing a mask and avoiding social gatherings- quarantine for 14 days. ED precautions discussed and she expresses good understanding  Time spent during visit with patient: 10 minutes

## 2019-06-20 NOTE — Telephone Encounter (Signed)
Scheduled patient for appointment for COVID 19 test tomorrow at 10:45 am at [facility].  Testing protocol reviewed with patient.

## 2019-06-21 ENCOUNTER — Other Ambulatory Visit: Payer: Medicare Other

## 2019-06-21 DIAGNOSIS — Z20822 Contact with and (suspected) exposure to covid-19: Secondary | ICD-10-CM

## 2019-06-21 DIAGNOSIS — R6889 Other general symptoms and signs: Secondary | ICD-10-CM | POA: Diagnosis not present

## 2019-06-26 ENCOUNTER — Telehealth: Payer: Self-pay | Admitting: Family Medicine

## 2019-06-26 NOTE — Telephone Encounter (Signed)
Pt is calling to check on the results from her covid testing. Please call with results.

## 2019-06-27 LAB — NOVEL CORONAVIRUS, NAA: SARS-CoV-2, NAA: NOT DETECTED

## 2019-06-27 NOTE — Telephone Encounter (Signed)
Please inform patient her results have not yet resulted.

## 2019-06-27 NOTE — Telephone Encounter (Signed)
Called and informed patient that Covid 19 test results have not come in.  Patient will be called when test are resulted.  Brandy Foster, [place]

## 2019-06-28 ENCOUNTER — Encounter: Payer: Self-pay | Admitting: Family Medicine

## 2019-06-28 NOTE — Telephone Encounter (Signed)
Pt informed. Deseree Blount, CMA  

## 2019-06-28 NOTE — Progress Notes (Signed)
Attempted to contact patient to inform her of her negative COVID result. No answer. Letter sent.

## 2019-06-28 NOTE — Telephone Encounter (Signed)
Please give her a call and let her know her results were negative. Thank you.

## 2019-06-28 NOTE — Telephone Encounter (Signed)
Pt is calling and said that she missed a call around 9am this morning 07/08. I was not sure who called and she said she did not have a vm.   Pt asked if it is about her covid results for someone to please call her back at [phone].

## 2019-07-20 DIAGNOSIS — H40153 Residual stage of open-angle glaucoma, bilateral: Secondary | ICD-10-CM | POA: Diagnosis not present

## 2019-08-02 NOTE — Progress Notes (Signed)
HEMATOLOGY/ONCOLOGY CONSULTATION NOTE  Date of Service: 08/02/2019  Patient Care Team: Danna Hefty, DO as PCP - General (Family Medicine)   CHIEF COMPLAINTS/PURPOSE OF CONSULTATION:  Iron deficiency anemia   HISTORY OF PRESENTING ILLNESS:  Brandy Foster is a wonderful 43 y.o. female who has been referred to [place] by Dr. Harriet Butte for evaluation and management of Iron deficiency anemia. The pt reports that she is doing well overall.   The pt notes that she currently feels tired, and denies any overt light headedness or dizziness.   However, the pt reports that she has had sensations that she is about to pass out for a few months, and that this was worse in the summertime. She notes that she hasn't felt as though she was going to pass out recently. She notes she has intermittently taken PO Iron replacement for several years. She is taking one pill of 325mg  Ferrous Sulfate a day, but forgets some days, and takes it with food, but notes that she has some abdominal discomfort with PO Iron replacement. The pt notes that she has ice cravings.  The pt notes that she has heavy periods, which has only been the case for the past few months. She notes that she uses two pads simultaneously for the first couple days and her periods last about 5 days. The pt notes that she last saw her OBGYN about a year ago. She notes that she had small fibroids 10 years ago, which she discovered during her second pregnancy. She has had three pregnancies, two successful pregnancies 10 and 21 years ago, and one miscarriage a "few years ago." She denies any recent surgeries, and denies any blood in the stools or other blood loss.   The pt denies any dietary restrictions. She denies taking any acid suppressant medication.   The pt notes that she has glaucoma and uses nightly eye drops. She denies any other medical problems.   Most recent lab results (09/07/18) of CBC w/diff is as follows: all values are WNL  except for RBC at 3.63, HGB at 7.7, HCT at 26.4, MCV at 73, MCH at 21.2, MCHC at 29.2, RDW at 16.1. 09/15/18 Anemia Panel revealed all values WNL except for Iron at 20, Iron Saturation at 5%, HCT at 276, Ferritin at 3.   On review of systems, pt reports feeling tired, heavy periods, and denies blood in the stools, current light headedness or dizziness, black stools, abdominal pains, leg swelling, and any other symptoms.   On PMHx the pt reports two cesarean sections 21 and 10 years ago, Glaucoma. Denies liver problems.  On Family Hx the pt reports iron deficiency anemia and denies sickle cell disease, blood disorders   INTERVAL HISTORY:  Brandy Foster is here today for follow up and treatment of her Iron Deficiency Anemia. The patient's last visit with [place] was on 02/02/2019. The pt reports that she is doing well overall.  The pt reports continued heavy periods and some fatigue  Lab results today (08/02/19) of CBC show worsening anemia hgb down to 10.5 . Ferritin is down to 5 with iron saturation of 10%.   MEDICAL HISTORY:  Past Medical History:  Diagnosis Date  . Anemia   . Anxiety   . Depression   . Obesity     SURGICAL HISTORY: Past Surgical History:  Procedure Laterality Date  . CESAREAN SECTION      SOCIAL HISTORY: Social History   Socioeconomic History  . Marital status: Single  Spouse name: Not on file  . Number of children: Not on file  . Years of education: Not on file  . Highest education level: Not on file  Occupational History  . Not on file  Social Needs  . Financial resource strain: Not on file  . Food insecurity    Worry: Not on file    Inability: Not on file  . Transportation needs    Medical: Not on file    Non-medical: Not on file  Tobacco Use  . Smoking status: Never Smoker  . Smokeless tobacco: Never Used  Substance and Sexual Activity  . Alcohol use: No  . Drug use: No  . Sexual activity: Yes    Birth control/protection: None   Lifestyle  . Physical activity    Days per week: Not on file    Minutes per session: Not on file  . Stress: Not on file  Relationships  . Social Herbalist on phone: Not on file    Gets together: Not on file    Attends religious service: Not on file    Active member of club or organization: Not on file    Attends meetings of clubs or organizations: Not on file    Relationship status: Not on file  . Intimate partner violence    Fear of current or ex partner: Not on file    Emotionally abused: Not on file    Physically abused: Not on file    Forced sexual activity: Not on file  Other Topics Concern  . Not on file  Social History Narrative   On disability- not sure what her disability is- for mental  Health reasons.  Unsure of diagnosis.     Mother gets disability check and gives pt the money. - mom pays bills and then gives her the rest.    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    Lives at rental home- has 2 children (ages 86 (melanie) and 35 (jabaree))             FAMILY HISTORY: Family History  Problem Relation Age of Onset  . Diabetes Mother   . COPD Mother   . Arthritis Mother   . Hypertension Mother   . Hyperlipidemia Father   . Schizophrenia Brother     ALLERGIES:  has No Known Allergies.  MEDICATIONS:  Current Outpatient Medications  Medication Sig Dispense Refill  . ferrous sulfate 325 (65 FE) MG tablet Take 1 tablet (325 mg total) by mouth 2 (two) times daily with a meal. 60 tablet 2  . ferumoxytol (FERAHEME) 510 MG/17ML SOLN injection Inject 17 mLs (510 mg total) into the vein once for 1 dose. 17 mL 0  . LATANOPROST OP Place 1 drop into both eyes at bedtime.    . naproxen (NAPROSYN) 500 MG tablet Take 1 tablet (500 mg total) by mouth 2 (two) times daily. 20 tablet 0  . polyethylene glycol powder (GLYCOLAX/MIRALAX) powder Take 17 g by mouth as needed for mild  constipation or moderate constipation. 3350 g 1   No current facility-administered medications for this visit.     REVIEW OF SYSTEMS:   A 10+ POINT REVIEW OF SYSTEMS WAS OBTAINED including neurology, dermatology, psychiatry, cardiac, respiratory, lymph, extremities, GI, GU, Musculoskeletal, constitutional, breasts, reproductive, HEENT.  All pertinent positives are noted in the HPI.  All others are negative.     PHYSICAL EXAMINATION:  .  Vitals:   08/03/19 1206  BP: 121/71  Pulse: 86  Resp: 17  Temp: 98.5 F (36.9 C)  SpO2: 100%   Filed Weights   08/03/19 1206  Weight: 292 lb 11.2 oz (132.8 kg)   Body mass index is 43.86 kg/m. Marland Kitchen GENERAL:alert, in no acute distress and comfortable SKIN: no acute rashes, no significant lesions EYES: conjunctiva are pink and non-injected, sclera anicteric OROPHARYNX: MMM, no exudates, no oropharyngeal erythema or ulceration NECK: supple, no JVD LYMPH:  no palpable lymphadenopathy in the cervical, axillary or inguinal regions LUNGS: clear to auscultation b/l with normal respiratory effort HEART: regular rate & rhythm ABDOMEN:  normoactive bowel sounds , non tender, not distended. Extremity: no pedal edema PSYCH: alert & oriented x 3 with fluent speech NEURO: no focal motor/sensory deficits    LABORATORY DATA:  I have reviewed the data as listed  . CBC Latest Ref Rng & Units 08/03/2019 02/02/2019 11/29/2018  WBC 4.0 - 10.5 K/uL 8.7 7.1 9.7  Hemoglobin 12.0 - 15.0 g/dL 10.5(L) 11.3(L) 9.4(L)  Hematocrit 36.0 - 46.0 % 35.6(L) 37.3 31.3(L)  Platelets 150 - 400 K/uL 293 251 328    . CMP Latest Ref Rng & Units 08/03/2019 11/29/2018 09/01/2016  Glucose 70 - 99 mg/dL 86 100(H) 108(H)  BUN 6 - 20 mg/dL 10 8 8   Creatinine 0.44 - 1.00 mg/dL 0.79 0.88 0.83  Sodium 135 - 145 mmol/L 137 139 140  Potassium 3.5 - 5.1 mmol/L 4.1 3.9 4.3  Chloride 98 - 111 mmol/L 105 108 108  CO2 22 - 32 mmol/L 23 25 23   Calcium 8.9 - 10.3 mg/dL 9.1 9.0 9.1  Total  Protein 6.5 - 8.1 g/dL 6.9 7.1 -  Total Bilirubin 0.3 - 1.2 mg/dL 0.2(L) 0.2(L) -  Alkaline Phos 38 - 126 U/L 55 54 -  AST 15 - 41 U/L 15 12(L) -  ALT 0 - 44 U/L 7 <6 -   . Lab Results  Component Value Date   IRON 41 08/03/2019   TIBC 400 08/03/2019   IRONPCTSAT 10 (L) 08/03/2019   (Iron and TIBC)  Lab Results  Component Value Date   FERRITIN 5 (L) 08/03/2019      RADIOGRAPHIC STUDIES: I have personally reviewed the radiological images as listed and agreed with the findings in the report. No results found.  ASSESSMENT & PLAN:   43 y.o. female with  1. Severe Iron deficiency anemia - related to menorrhagia  PLAN -Discussed patient's most recent labs hgb down to 10.5 -today Anemia Panel revealed 10% Iron Saturation, and a Ferritin of 5 -Patient is significantly iron deficient, likely related to her menorrhagia -Advised that the pt follow up with OBGYN for further evaluation of her menorrhagia and history of fibroids -Will order IV Injectafer x weekly for 2 doses if patient agreeable -Recommend that the pt begin taking a Vitamin B complex OTC  FOLLOW UP: IV Iron weekly x 2 doses RTC with Dr Irene Limbo with labs in 4 months   All of the patients questions were answered with apparent satisfaction. The patient knows to call the clinic with any problems, questions or concerns.  The total time spent in the appt was 15 minutes and more than 50% was on counseling and direct patient cares.     Sullivan Lone MD [place] AAHIVMS [facility] Hematology/Oncology Physician [facility]  (Office):       [phone] (Work cell):  [phone] (Fax):  [phone]  08/02/2019 1:05 PM  I, [employer], am acting as a Education administrator for Dr. Sullivan Lone.   .I have reviewed the above documentation for accuracy and completeness, and I agree with the above. Brunetta Genera MD

## 2019-08-03 ENCOUNTER — Inpatient Hospital Stay: Payer: Medicare Other | Attending: Hematology

## 2019-08-03 ENCOUNTER — Inpatient Hospital Stay (HOSPITAL_BASED_OUTPATIENT_CLINIC_OR_DEPARTMENT_OTHER): Payer: Medicare Other | Admitting: Hematology

## 2019-08-03 ENCOUNTER — Telehealth: Payer: Self-pay | Admitting: Hematology

## 2019-08-03 ENCOUNTER — Other Ambulatory Visit: Payer: Self-pay

## 2019-08-03 ENCOUNTER — Other Ambulatory Visit: Payer: Self-pay | Admitting: *Deleted

## 2019-08-03 VITALS — BP 121/71 | HR 86 | Temp 98.5°F | Resp 17 | Ht 68.5 in | Wt 292.7 lb

## 2019-08-03 DIAGNOSIS — F419 Anxiety disorder, unspecified: Secondary | ICD-10-CM | POA: Insufficient documentation

## 2019-08-03 DIAGNOSIS — D500 Iron deficiency anemia secondary to blood loss (chronic): Secondary | ICD-10-CM

## 2019-08-03 DIAGNOSIS — N920 Excessive and frequent menstruation with regular cycle: Secondary | ICD-10-CM | POA: Insufficient documentation

## 2019-08-03 DIAGNOSIS — Z79899 Other long term (current) drug therapy: Secondary | ICD-10-CM | POA: Diagnosis not present

## 2019-08-03 DIAGNOSIS — E669 Obesity, unspecified: Secondary | ICD-10-CM | POA: Insufficient documentation

## 2019-08-03 DIAGNOSIS — Z791 Long term (current) use of non-steroidal anti-inflammatories (NSAID): Secondary | ICD-10-CM | POA: Diagnosis not present

## 2019-08-03 LAB — CBC WITH DIFFERENTIAL (CANCER CENTER ONLY)
Abs Immature Granulocytes: 0.02 10*3/uL (ref 0.00–0.07)
Basophils Absolute: 0.1 10*3/uL (ref 0.0–0.1)
Basophils Relative: 1 %
Eosinophils Absolute: 0.2 10*3/uL (ref 0.0–0.5)
Eosinophils Relative: 2 %
HCT: 35.6 % — ABNORMAL LOW (ref 36.0–46.0)
Hemoglobin: 10.5 g/dL — ABNORMAL LOW (ref 12.0–15.0)
Immature Granulocytes: 0 %
Lymphocytes Relative: 31 %
Lymphs Abs: 2.7 10*3/uL (ref 0.7–4.0)
MCH: 27.5 pg (ref 26.0–34.0)
MCHC: 29.5 g/dL — ABNORMAL LOW (ref 30.0–36.0)
MCV: 93.2 fL (ref 80.0–100.0)
Monocytes Absolute: 0.9 10*3/uL (ref 0.1–1.0)
Monocytes Relative: 10 %
Neutro Abs: 4.9 10*3/uL (ref 1.7–7.7)
Neutrophils Relative %: 56 %
Platelet Count: 293 10*3/uL (ref 150–400)
RBC: 3.82 MIL/uL — ABNORMAL LOW (ref 3.87–5.11)
RDW: 15.2 % (ref 11.5–15.5)
WBC Count: 8.7 10*3/uL (ref 4.0–10.5)
nRBC: 0 % (ref 0.0–0.2)

## 2019-08-03 LAB — CMP (CANCER CENTER ONLY)
ALT: 7 U/L (ref 0–44)
AST: 15 U/L (ref 15–41)
Albumin: 3.3 g/dL — ABNORMAL LOW (ref 3.5–5.0)
Alkaline Phosphatase: 55 U/L (ref 38–126)
Anion gap: 9 (ref 5–15)
BUN: 10 mg/dL (ref 6–20)
CO2: 23 mmol/L (ref 22–32)
Calcium: 9.1 mg/dL (ref 8.9–10.3)
Chloride: 105 mmol/L (ref 98–111)
Creatinine: 0.79 mg/dL (ref 0.44–1.00)
GFR, Est AFR Am: >60 mL/min (ref >60)
GFR, Estimated: >60 mL/min (ref >60)
Glucose, Bld: 86 mg/dL (ref 70–99)
Potassium: 4.1 mmol/L (ref 3.5–5.1)
Sodium: 137 mmol/L (ref 135–145)
Total Bilirubin: 0.2 mg/dL — ABNORMAL LOW (ref 0.3–1.2)
Total Protein: 6.9 g/dL (ref 6.5–8.1)

## 2019-08-03 LAB — IRON AND TIBC
Iron: 41 ug/dL (ref 41–142)
Saturation Ratios: 10 % — ABNORMAL LOW (ref 21–57)
TIBC: 400 ug/dL (ref 236–444)
UIBC: 360 ug/dL (ref 120–384)

## 2019-08-03 LAB — FERRITIN: Ferritin: 5 ng/mL — ABNORMAL LOW (ref 11–307)

## 2019-08-03 MED ORDER — POLYSACCHARIDE IRON COMPLEX 150 MG PO CAPS: 150.0000 mg | ORAL_CAPSULE | Freq: Two times a day (BID) | ORAL | 3 refills | Status: DC

## 2019-08-03 NOTE — Telephone Encounter (Signed)
Scheduled per 08/13 los, patient received avs and calender.

## 2019-08-08 ENCOUNTER — Telehealth: Payer: Self-pay | Admitting: *Deleted

## 2019-08-08 ENCOUNTER — Telehealth: Payer: Self-pay | Admitting: Hematology

## 2019-08-08 NOTE — Telephone Encounter (Signed)
-----   Message from Brunetta Genera, MD sent at 08/08/2019  4:02 AM EDT ----- Plz let patient know her iron levels are very low. Would recommend repeating IV Iron weekly x 2 doses.Orders are in. plz send scheduling message if patient agreeable.

## 2019-08-08 NOTE — Telephone Encounter (Signed)
Scheduled appt per 8/18 sch message - pt aware of apt date and time

## 2019-08-08 NOTE — Telephone Encounter (Signed)
Contacted patient regarding test results per Dr. Grier Mitts directions as noted. Patient verbalized understanding and is in agreement to have IV iron. Schedule message sent.

## 2019-08-18 ENCOUNTER — Inpatient Hospital Stay: Payer: Medicare Other

## 2019-08-18 ENCOUNTER — Other Ambulatory Visit: Payer: Self-pay

## 2019-08-18 VITALS — BP 118/65 | HR 72 | Temp 97.8°F | Resp 18

## 2019-08-18 DIAGNOSIS — F419 Anxiety disorder, unspecified: Secondary | ICD-10-CM | POA: Diagnosis not present

## 2019-08-18 DIAGNOSIS — D500 Iron deficiency anemia secondary to blood loss (chronic): Secondary | ICD-10-CM

## 2019-08-18 DIAGNOSIS — Z791 Long term (current) use of non-steroidal anti-inflammatories (NSAID): Secondary | ICD-10-CM | POA: Diagnosis not present

## 2019-08-18 DIAGNOSIS — N920 Excessive and frequent menstruation with regular cycle: Secondary | ICD-10-CM | POA: Diagnosis not present

## 2019-08-18 DIAGNOSIS — Z79899 Other long term (current) drug therapy: Secondary | ICD-10-CM | POA: Diagnosis not present

## 2019-08-18 DIAGNOSIS — E669 Obesity, unspecified: Secondary | ICD-10-CM | POA: Diagnosis not present

## 2019-08-18 MED ORDER — SODIUM CHLORIDE 0.9 % IV SOLN
Freq: Once | INTRAVENOUS | Status: AC
  Administered 2019-08-18: 14:00:00 via INTRAVENOUS
  Filled 2019-08-18: qty 250

## 2019-08-18 MED ORDER — SODIUM CHLORIDE 0.9 % IV SOLN
750.0000 mg | Freq: Once | INTRAVENOUS | Status: AC
  Administered 2019-08-18: 15:00:00 750 mg via INTRAVENOUS
  Filled 2019-08-18: qty 15

## 2019-08-18 NOTE — Progress Notes (Signed)
Patient declined to stay complete 30 minutes post observation. Patient offered nutrition, vital signs stable and iron infusion was tolerated well.

## 2019-08-18 NOTE — Patient Instructions (Signed)

## 2019-08-25 ENCOUNTER — Inpatient Hospital Stay: Payer: Medicare Other

## 2019-08-30 ENCOUNTER — Telehealth: Payer: Self-pay | Admitting: Hematology

## 2019-08-30 NOTE — Telephone Encounter (Signed)
R/s appt per 9/4 sch messag e- pt aware of new appt date and time

## 2019-09-01 ENCOUNTER — Inpatient Hospital Stay: Payer: Medicare Other | Attending: Hematology

## 2019-09-01 ENCOUNTER — Other Ambulatory Visit: Payer: Self-pay

## 2019-09-01 VITALS — BP 117/65 | HR 86 | Temp 98.5°F | Resp 18

## 2019-09-01 DIAGNOSIS — D500 Iron deficiency anemia secondary to blood loss (chronic): Secondary | ICD-10-CM | POA: Diagnosis not present

## 2019-09-01 MED ORDER — SODIUM CHLORIDE 0.9 % IV SOLN
Freq: Once | INTRAVENOUS | Status: AC
  Administered 2019-09-01: 15:00:00 via INTRAVENOUS
  Filled 2019-09-01: qty 250

## 2019-09-01 MED ORDER — SODIUM CHLORIDE 0.9 % IV SOLN
750.0000 mg | Freq: Once | INTRAVENOUS | Status: AC
  Administered 2019-09-01: 750 mg via INTRAVENOUS
  Filled 2019-09-01: qty 15

## 2019-09-01 NOTE — Patient Instructions (Signed)

## 2019-10-19 DIAGNOSIS — R410 Disorientation, unspecified: Secondary | ICD-10-CM | POA: Diagnosis not present

## 2019-10-19 DIAGNOSIS — Z23 Encounter for immunization: Secondary | ICD-10-CM | POA: Diagnosis not present

## 2019-10-19 DIAGNOSIS — I10 Essential (primary) hypertension: Secondary | ICD-10-CM | POA: Diagnosis not present

## 2019-10-19 DIAGNOSIS — R110 Nausea: Secondary | ICD-10-CM | POA: Diagnosis not present

## 2019-10-19 DIAGNOSIS — S0990XA Unspecified injury of head, initial encounter: Secondary | ICD-10-CM | POA: Diagnosis not present

## 2019-10-19 DIAGNOSIS — W19XXXA Unspecified fall, initial encounter: Secondary | ICD-10-CM | POA: Diagnosis not present

## 2019-10-19 DIAGNOSIS — S0101XA Laceration without foreign body of scalp, initial encounter: Secondary | ICD-10-CM | POA: Diagnosis not present

## 2019-10-19 DIAGNOSIS — M542 Cervicalgia: Secondary | ICD-10-CM | POA: Diagnosis not present

## 2019-10-19 DIAGNOSIS — R404 Transient alteration of awareness: Secondary | ICD-10-CM | POA: Diagnosis not present

## 2019-10-19 DIAGNOSIS — Z20828 Contact with and (suspected) exposure to other viral communicable diseases: Secondary | ICD-10-CM | POA: Diagnosis not present

## 2019-10-27 ENCOUNTER — Encounter: Payer: Self-pay | Admitting: Family Medicine

## 2019-10-27 ENCOUNTER — Ambulatory Visit (INDEPENDENT_AMBULATORY_CARE_PROVIDER_SITE_OTHER): Payer: Medicare Other | Admitting: Family Medicine

## 2019-10-27 ENCOUNTER — Other Ambulatory Visit: Payer: Self-pay

## 2019-10-27 VITALS — BP 130/92 | HR 96 | Temp 98.9°F | Wt 290.8 lb

## 2019-10-27 DIAGNOSIS — Z4802 Encounter for removal of sutures: Secondary | ICD-10-CM | POA: Insufficient documentation

## 2019-10-27 DIAGNOSIS — F0781 Postconcussional syndrome: Secondary | ICD-10-CM | POA: Diagnosis not present

## 2019-10-27 DIAGNOSIS — J302 Other seasonal allergic rhinitis: Secondary | ICD-10-CM | POA: Diagnosis not present

## 2019-10-27 MED ORDER — FLUTICASONE PROPIONATE 50 MCG/ACT NA SUSP: 2.0000 | Freq: Every day | NASAL | 6 refills | Status: DC

## 2019-10-27 MED ORDER — CETIRIZINE HCL 10 MG PO TABS: 10.0000 mg | ORAL_TABLET | Freq: Every day | ORAL | 11 refills | Status: AC | PRN

## 2019-10-27 NOTE — Assessment & Plan Note (Signed)
Doubt that symptoms are caused by infection given patient has not had any sick contacts.  She does not have a fever today in clinic and has not had one previously.  Exam with cobblestoning and swollen turbinates likely secondary to allergic rhinitis.  Will treat with this first.  Advised to get Mucinex and use honey for cough.  Sent Rx for Flonase and Zyrtec to use.  Advised if no improvement in 1 week to come back, could consider treatment for bacterial sinusitis at that time.

## 2019-10-27 NOTE — Patient Instructions (Signed)
Thank you for coming to see me today. It was a pleasure. Today we talked about:   Your head: Continue tylenol for pain.  Avoid the muscle relaxers.  You likely have a concussion.  Limit screen time and stimulating activities.  If you have worsening in headache, increased dizziness, difficulty walking, vomiting, or no improvement in your symptoms in the next few weeks, you should be seen.  Your cough: It sounds like you have allergies, I have sent a prescription for Zyrtec to take daily and Flonase to also use.  You can use honey as needed for cough.  If you do not have improvement in the next week, please come back.  If you have any questions or concerns, please do not hesitate to call the office at [phone].  Best,   Arizona Constable, DO

## 2019-10-27 NOTE — Assessment & Plan Note (Signed)
See procedure above.  Staple removed today.  Patient tolerated procedure well.

## 2019-10-27 NOTE — Assessment & Plan Note (Signed)
Patient likely having postconcussive syndrome.  Advised to rest brain, limit stimulation including screen time on phone and TV.  Advised continue Tylenol.  Avoid Flexeril given can make her sleepy and increased risk of fall.  Records reviewed from [facility], no evidence of bleed on CT head.  Neuro exam today is reassuring.  Advised her that if symptoms worsen, she has changes in ambulation, vomiting, she should be seen right away.  Advised if she does not have improvement in the next few weeks, to come back.

## 2019-10-27 NOTE — Progress Notes (Signed)
Subjective: Chief Complaint  Patient presents with  . Suture / Staple Removal     HPI: Brandy Foster is a 43 y.o. presenting to clinic today to discuss the following:  1 Recent Fall Patient reports that 9 days ago, she fell down wet stairs and hit her head.  She reports loss of consciousness.  Said that she was seen at [facility] and had imaging of her head, was told that she did not have a brain bleed.  States that she did have 2 staples placed in her head, but 1 has fallen out since then.  She presents today for removal of sutures.  She still states that she has headaches off and on, mostly in the front and back of her head.  Denies any changes in vision.  States that she occasionally feels dizzy, but mostly complaining of headache.  States that her symptoms have not worsened since the fall.  She was given Flexeril and told to use Tylenol as needed for pain.  States that she is only taken about 3 Flexeril.  Records reviewed from [facility].  2 Cough Reports that for the last 2 to 3 weeks, she has been having cough off and on with whitish-yellow sputum.  Reports that she has had no difficulty breathing, fevers, sick contacts.  States everyone else around her is feeling well.  Denies sore throat.  Also notes occasional stuffiness.  No known COVID-19 contacts.     ROS noted in HPI. Chief complaint noted.  Other Pertinent PMH: History of iron deficiency anemia due to chronic blood loss, obesity Past Medical, Surgical, Social, and Family History Reviewed & Updated per EMR.      Social History   Tobacco Use  Smoking Status Never Smoker  Smokeless Tobacco Never Used   Smoking status noted.    Objective: BP (!) 130/92   Pulse 96   Temp 98.9 F (37.2 C)   Wt 290 lb 12.8 oz (131.9 kg)   SpO2 99%   BMI 43.57 kg/m  Vitals and nursing notes reviewed  Physical Exam:  General: 43 y.o. female in NAD HEENT: NCAT, PERRL, EOMI, MMM, throat clear with mild cobblestoning,  nonerythematous, nose with moderately swollen turbinates Lungs: Breathing comfortably on room air Skin: warm and dry Extremities: No edema Neuro: CN II through XII grossly intact, sensation intact throughout bilateral upper and lower extremities, strength intact in bilateral upper and lower extremities, negative finger-to-nose, negative heel-to-shin, gait normal  Verbal consent obtained from patient for procedure.  1 staple found at right occipital scalp, laceration well-healed, staples removed in usual fashion, patient tolerated procedure well.   No results found for this or any previous visit (from the past 72 hour(s)).  Assessment/Plan:  Encounter for removal of staples See procedure above.  Staple removed today.  Patient tolerated procedure well.  Postconcussion syndrome Patient likely having postconcussive syndrome.  Advised to rest brain, limit stimulation including screen time on phone and TV.  Advised continue Tylenol.  Avoid Flexeril given can make her sleepy and increased risk of fall.  Records reviewed from [facility], no evidence of bleed on CT head.  Neuro exam today is reassuring.  Advised her that if symptoms worsen, she has changes in ambulation, vomiting, she should be seen right away.  Advised if she does not have improvement in the next few weeks, to come back.  Seasonal allergic rhinitis Doubt that symptoms are caused by infection given patient has not had any sick contacts.  She does not have a fever today in clinic and has not had one previously.  Exam with cobblestoning and swollen turbinates likely secondary to allergic rhinitis.  Will treat with this first.  Advised to get Mucinex and use honey for cough.  Sent Rx for Flonase and Zyrtec to use.  Advised if no improvement in 1 week to come back, could consider treatment for bacterial sinusitis at that time.     PATIENT EDUCATION PROVIDED: See AVS    Diagnosis and plan along with any newly prescribed medication(s) were  discussed in detail with this patient today. The patient verbalized understanding and agreed with the plan. Patient advised if symptoms worsen return to clinic or ER.     No orders of the defined types were placed in this encounter.   Meds ordered this encounter  Medications  . cetirizine (ZYRTEC) 10 MG tablet    Sig: Take 1 tablet (10 mg total) by mouth daily as needed for allergies.    Dispense:  30 tablet    Refill:  11  . fluticasone (FLONASE) 50 MCG/ACT nasal spray    Sig: Place 2 sprays into both nostrils daily.    Dispense:  16 g    Refill:  [place], DO 10/27/2019, 5:06 PM PGY-2 [place]

## 2019-11-02 ENCOUNTER — Other Ambulatory Visit: Payer: Self-pay | Admitting: Family Medicine

## 2019-11-02 ENCOUNTER — Telehealth: Payer: Self-pay | Admitting: Family Medicine

## 2019-11-02 ENCOUNTER — Encounter: Payer: Self-pay | Admitting: Family Medicine

## 2019-11-02 DIAGNOSIS — Z7689 Persons encountering health services in other specified circumstances: Secondary | ICD-10-CM

## 2019-11-02 NOTE — Telephone Encounter (Signed)
Referral placed. Thank you

## 2019-11-02 NOTE — Telephone Encounter (Signed)
Pt needs a referral to put in for PT. She is going to [facility] in [facility].

## 2019-11-07 NOTE — Telephone Encounter (Signed)
Patient would also like a referral to a neurologist as she did hit her head when she had a fall recently.

## 2019-11-15 ENCOUNTER — Ambulatory Visit (INDEPENDENT_AMBULATORY_CARE_PROVIDER_SITE_OTHER): Payer: Medicare Other | Admitting: Family Medicine

## 2019-11-15 ENCOUNTER — Other Ambulatory Visit: Payer: Self-pay

## 2019-11-15 VITALS — BP 128/76 | HR 78 | Wt 294.6 lb

## 2019-11-15 DIAGNOSIS — F0781 Postconcussional syndrome: Secondary | ICD-10-CM | POA: Diagnosis not present

## 2019-11-15 NOTE — Progress Notes (Signed)
     Subjective: Chief Complaint  Patient presents with  . Headache    from fall     HPI: Brandy Foster is a 43 y.o. presenting to clinic today to discuss the following:  1 Headache S/P Fall Patient seen on 11/6 following fall on 10/29 during which she lost consciousness and had a laceration on right occipital region of head.  Sutures were removed on 11/6.  At that time, patient was endorsing some dizziness and headache that was in the front and back of her head.  She notes that she continues to have these headaches.  States that they have not worsened, but have not improved.  States that she is taking Tylenol with only mild improvement.  Not taking flexeril.  States that the pain is usually worse after looking at her phone and watching TV.       ROS noted in HPI. Chief complaint noted.  Other Pertinent PMH: Iron deficiency anemia due to chronic blood loss, obesity Past Medical, Surgical, Social, and Family History Reviewed & Updated per EMR.      Social History   Tobacco Use  Smoking Status Never Smoker  Smokeless Tobacco Never Used   Smoking status noted.    Objective: BP 128/76   Pulse 78   Wt 294 lb 9.6 oz (133.6 kg)   SpO2 98%   BMI 44.14 kg/m  Vitals and nursing notes reviewed  Physical Exam:  General: 43 y.o. female in NAD HEENT: PERRL. EOMI Neuro: CN II-XII intact, sensation intact BUE/BLE, 5/5 strength BUE/BLE, negative finger to nose, good balance with both feet and each foot by itself Lungs: breathing comfortably on RA Skin: crusting at posterior left scalp in occipital region without surround erythema, no discharge  SAC: 29/30   No results found for this or any previous visit (from the past 72 hour(s)).  Assessment/Plan:  Postconcussion syndrome Neuro exam reassuring and symptoms are not worse than last visit.  Reassured that her initial CT head and neck were WNL per care everywhere from initial presentation on 10/29.  Discussed postconcussion  syndrome with patient and that she can have headaches for many months afterwards.  [place] scores reassuring.  Advised decreasing screen time by half as this is causing her to have worsening headaches.  SCAT symptom evaluation performed today.  Continue Tylenol as needed.  Patient to follow-up in 4 weeks for continued monitoring.     PATIENT EDUCATION PROVIDED: See AVS    Diagnosis and plan along with any newly prescribed medication(s) were discussed in detail with this patient today. The patient verbalized understanding and agreed with the plan. Patient advised if symptoms worsen return to clinic or ER.   Health Maintainance: Advised that she needs to return for Pap as soon as possible.   No orders of the defined types were placed in this encounter.   No orders of the defined types were placed in this encounter.    Ari[place] Constable, DO 11/15/2019, 4:19 PM PGY-2 [place]

## 2019-11-15 NOTE — Assessment & Plan Note (Signed)
Neuro exam reassuring and symptoms are not worse than last visit.  Reassured that her initial CT head and neck were WNL per care everywhere from initial presentation on 10/29.  Discussed postconcussion syndrome with patient and that she can have headaches for many months afterwards.  [place] scores reassuring.  Advised decreasing screen time by half as this is causing her to have worsening headaches.  SCAT symptom evaluation performed today.  Continue Tylenol as needed.  Patient to follow-up in 4 weeks for continued monitoring.

## 2019-11-15 NOTE — Patient Instructions (Signed)
Thank you for coming to see me today. It was a pleasure. Today we talked about:   Your headache:  It can be multiple months before your headaches from the fall completely go away.  Continue tylenol as needed.  Cut the time you are spending on your phone or TV by half.  Please follow-up with me in 4 weeks or sooner as needed.  If you have any questions or concerns, please do not hesitate to call the office at [phone].  Best,   Ari[place] Constable, DO

## 2019-11-30 DIAGNOSIS — H40153 Residual stage of open-angle glaucoma, bilateral: Secondary | ICD-10-CM | POA: Diagnosis not present

## 2019-12-04 ENCOUNTER — Inpatient Hospital Stay: Payer: Medicare Other | Admitting: Hematology

## 2019-12-04 ENCOUNTER — Inpatient Hospital Stay: Payer: Medicare Other

## 2019-12-13 ENCOUNTER — Ambulatory Visit: Payer: Medicare Other | Admitting: Family Medicine

## 2019-12-28 ENCOUNTER — Inpatient Hospital Stay: Payer: Medicare Other

## 2019-12-28 ENCOUNTER — Telehealth: Payer: Self-pay | Admitting: Hematology

## 2019-12-28 ENCOUNTER — Inpatient Hospital Stay: Payer: Medicare Other | Admitting: Hematology

## 2019-12-28 NOTE — Telephone Encounter (Signed)
Returned patient's phone call regarding rescheduling 01/07 appointment, per patient's request appointment has moved to 02/02.

## 2020-01-23 ENCOUNTER — Inpatient Hospital Stay: Payer: Medicare Other | Admitting: Hematology

## 2020-01-23 ENCOUNTER — Inpatient Hospital Stay: Payer: Medicare Other | Attending: Hematology

## 2020-01-29 ENCOUNTER — Telehealth: Payer: Self-pay | Admitting: Hematology

## 2020-01-29 NOTE — Telephone Encounter (Signed)
I talk with patient regarding reschedule per patient request afternoon

## 2020-02-20 ENCOUNTER — Inpatient Hospital Stay: Payer: Medicare Other | Admitting: Hematology

## 2020-02-20 ENCOUNTER — Inpatient Hospital Stay: Payer: Medicare Other | Attending: Hematology

## 2020-05-21 DIAGNOSIS — F0781 Postconcussional syndrome: Secondary | ICD-10-CM | POA: Diagnosis not present

## 2020-05-21 DIAGNOSIS — H539 Unspecified visual disturbance: Secondary | ICD-10-CM | POA: Diagnosis not present

## 2020-06-03 ENCOUNTER — Ambulatory Visit: Payer: Medicare Other | Attending: Physical Medicine and Rehabilitation

## 2020-06-03 DIAGNOSIS — R2681 Unsteadiness on feet: Secondary | ICD-10-CM | POA: Insufficient documentation

## 2020-06-03 DIAGNOSIS — R42 Dizziness and giddiness: Secondary | ICD-10-CM | POA: Insufficient documentation

## 2020-06-03 DIAGNOSIS — M542 Cervicalgia: Secondary | ICD-10-CM | POA: Insufficient documentation

## 2020-06-03 DIAGNOSIS — M6281 Muscle weakness (generalized): Secondary | ICD-10-CM | POA: Insufficient documentation

## 2020-06-07 ENCOUNTER — Other Ambulatory Visit: Payer: Self-pay

## 2020-06-07 ENCOUNTER — Ambulatory Visit: Payer: Medicare Other | Admitting: Physical Therapy

## 2020-06-07 ENCOUNTER — Encounter: Payer: Self-pay | Admitting: Physical Therapy

## 2020-06-07 DIAGNOSIS — M6281 Muscle weakness (generalized): Secondary | ICD-10-CM | POA: Diagnosis present

## 2020-06-07 DIAGNOSIS — R42 Dizziness and giddiness: Secondary | ICD-10-CM | POA: Diagnosis not present

## 2020-06-07 DIAGNOSIS — R2681 Unsteadiness on feet: Secondary | ICD-10-CM | POA: Diagnosis present

## 2020-06-07 DIAGNOSIS — M542 Cervicalgia: Secondary | ICD-10-CM | POA: Diagnosis present

## 2020-06-07 NOTE — Therapy (Addendum)
[facility] [address] [place], [place], [address] Phone: [phone]   Fax:  [phone]  Physical Therapy Evaluation  Patient Details  Name: Brandy Foster MRN: [id number] Date of Birth: [birth year] Referring Provider (PT): Shuping, Rockney Ghee, MD   Encounter Date: 06/07/2020   PT End of Session - 06/07/20 1653    Visit Number 1    Number of Visits 7    Date for PT Re-Evaluation 07/22/20    Authorization Type Medicare/Medicaid    PT Start Time 1117    PT Stop Time 1200    PT Time Calculation (min) 43 min    Activity Tolerance Patient tolerated treatment well    Behavior During Therapy [facility] for tasks assessed/performed           Past Medical History:  Diagnosis Date  . Anemia   . Anxiety   . Chronic mental illness 06/19/2011   Pt states that she is on disability 2/2 mental illness but she doesn't know what her diagnosis is.  Pt is to request records from her psychiatrist.    . Chronic mental illness 06/19/2011   Pt states that she is on disability 2/2 mental illness but she doesn't know what her diagnosis is.  Pt is to request records from her psychiatrist.    . Depression   . Obesity     Past Surgical History:  Procedure Laterality Date  . CESAREAN SECTION      There were no vitals filed for this visit.    Subjective Assessment - 06/07/20 1122    Subjective Pt reports she had a fall down a set of stairs, last year in October, with + LOC and laceration on posterior scalp that required staples.  Right after fall she began to have headaches and dizziness with movement.  Went to therapy after the fall at [facility] and that seemed to help with balance.  Pt also feels like vision is worse.  Has not been to the eye doctor since fall - goes next month.    Diagnostic tests Had a CT the night of the fall.  Is scheduled for an MRI.    Patient Stated Goals to feel better    Currently in Pain? Yes   no headache  today; comes and goes during the day   Pain Location Head    Pain Orientation Left    Pain Descriptors / Indicators Headache    Pain Type Chronic pain    Pain Onset More than a month ago    Pain Frequency Intermittent    Aggravating Factors  stress, bright lights, loud noises, if she forgets to take her contacts out    Pain Relieving Factors nap, lights out    Effect of Pain on Daily Activities feels nauseous               06/07/20 1129  Assessment  Medical Diagnosis Postconcussive syndrome  Referring Provider (PT) Shuping, Rockney Ghee, MD  Onset Date/Surgical Date 10/19/19  Hand Dominance Right  Next MD Visit has been referred to neuro-ophthalmology in July  Prior Therapy yes after fall  Precautions  Precautions Other (comment)  Precaution Comments anemia, anxiety, chronic mental illness, depression, obesity  Balance Screen  Has the patient fallen in the past 6 months No  [place] residence  Living Arrangements Children  Type of [place] to enter  [place] One level  Additional  Comments is driving  Prior Function  Level of Independence Independent  Vocation On disability  Observation/Other Assessments  Focus on Therapeutic Outcomes (FOTO)  Not captured on evaluation  Sensation  Light Touch Appears Intact  ROM / Strength  AROM / PROM / Strength Strength  Strength  Overall Strength Deficits  Overall Strength Comments LUE and LLE WFL.  RUE and RLE 4-/5   Ambulation/Gait  Ambulation/Gait Yes  Ambulation/Gait Assistance 7: Independent  Gait Pattern [facility]        06/07/20 1136  Vestibular Assessment  General Observation muscle guarding in shoulders/neck  Symptom Behavior  Type of Dizziness  Lightheadedness;"Funny feeling in head"  Frequency of Dizziness intermittent  Duration of Dizziness minute  Symptom Nature Motion provoked;Positional  Aggravating Factors Sit to  stand  Relieving Factors No known relieving factors  Progression of Symptoms Better  Oculomotor Exam  Oculomotor Alignment Normal  Spontaneous Absent  Gaze-induced  Absent  Smooth Pursuits Intact  Saccades Intact  Comment Convergence impaired but distance not measured today  Oculomotor Exam-Fixation Suppressed   Left Head Impulse difficult to assess due to guarding and increased blinking; not able to move head quickly  Right Head Impulse difficult to assess due to guarding and increased blinking; not able to move head quickly  Vestibulo-Ocular Reflex  VOR to Slow Head Movement Normal  VOR Cancellation Comment (swimmy headed)  Positional Testing  Horizontal Canal Testing Horizontal Canal Right;Horizontal Canal Left  Horizontal Canal Right  Horizontal Canal Right Duration 0  Horizontal Canal Right Symptoms Normal  Horizontal Canal Left  Horizontal Canal Left Duration 0  Horizontal Canal Left Symptoms Normal  Positional Sensitivities  Sit to Supine 0  Supine to Left Side 0  Supine to Right Side 0  Supine to Sitting 1  Nose to Right Knee 0  Right Knee to Sitting 0  Nose to Left Knee 0  Left Knee to Sitting 0  Head Turning x 5 3  Head Nodding x 5 2  Pivot Right in Standing 0  Pivot Left in Standing 0  Rolling Right 0  Rolling Left 0  Orthostatics  BP supine (x 5 minutes) 90/70  BP standing (after 1 minute) 100/70  Orthostatics Comment lightheaded from supine > standing but resolved quickly                Objective measurements completed on examination: See above findings.               PT Education - 06/07/20 1652    Education Details clinical findings, PT POC and goals of treatment    Person(s) Educated Patient    Methods Explanation    Comprehension Verbalized understanding              PT Short Term Goals - 06/09/20 2038      PT SHORT TERM GOAL #1   Title Pt will participate in full assesment of cervical spine and will initiate HEP to  address ROM limitations and pain    Time 3    Period Weeks    Status New    Target Date 06/30/20      PT SHORT TERM GOAL #2   Title Pt will participate in further assessment of vestibular impairments and will initiate vestibular HEP    Baseline need to assess DVA, SOT or MCTSIB, hallpike-dix    Time 3    Period Weeks    Status New    Target Date 06/30/20  PT Long Term Goals - 06/09/20 2043      PT LONG TERM GOAL #1   Title Pt will demonstrate independence with final vestibular, balance and c-spine HEP    Time 6    Period Weeks    Status New    Target Date 07/24/20      PT LONG TERM GOAL #2   Title Pt will demonstrate 10-12 deg increase in cervical spine ROM and report 50% reduction in headaches    Baseline TBD ROM    Time 6    Period Weeks    Status New    Target Date 07/24/20      PT LONG TERM GOAL #3   Title SOT or MCTSIB goal as indicated    Baseline TBD    Time 6    Period Weeks    Status New    Target Date 07/24/20      PT LONG TERM GOAL #4   Title Pt will report no dizziness with supine <> sit or with repeated head turns/nods    Baseline mild-moderate dizziness    Time 6    Period Weeks    Status New    Target Date 07/24/20      PT LONG TERM GOAL #5   Title Pt will demonstrate 2 line difference on DVA indicating improved use of VOR    Baseline TBD    Time 6    Period Weeks    Status New    Target Date 07/24/20                06/07/20 1216  Plan  Clinical Impression Statement Patient is a 44 year old female referred to outpatient neurorehabilitation for assessment of post-concussive syndrome after a fall down stairs last year.  Pt's PMH is significant for the following: anemia, anxiety, chronic mental illness, depression, obesity.  Patient presents with the following impairments: disequilibrium and visual motion sensitivity, impaired vision which will be further assessed by neuro-ophthalmology, impaired cervical ROM with headaches and  neck pain, impaired strength in RUE and RLE and impaired balance.  Pt would benefit from skilled PT services to address these impairments to maximize functional mobility independence and decrease falls risk.  Personal Factors and Comorbidities Comorbidity 3+;Fitness;Time since onset of injury/illness/exacerbation  Comorbidities anemia, anxiety, chronic mental illness, depression, obesity  Examination-Activity Limitations Stand;Bed Mobility  Pt will benefit from skilled therapeutic intervention in order to improve on the following deficits Dizziness;Decreased strength;Impaired perceived functional ability;Pain;Decreased range of motion;Impaired vision/preception  Stability/Clinical Decision Making Stable/Uncomplicated  Clinical Decision Making Low  Rehab Potential Good  PT Frequency 1x / week  PT Duration 6 weeks  PT Treatment/Interventions ADLs/Self Care Home Management;Canalith Repostioning;Cryotherapy;Moist Heat;Functional mobility training;Therapeutic activities;Therapeutic exercise;Balance training;Neuromuscular re-education;Patient/family education;Manual techniques;Passive range of motion;Dry needling;Vestibular;Visual/perceptual remediation/compensation  PT Next Visit Plan Get visit report from neuro-ophthalmology.  Assess Hallpike-dix and DVA.  SOT or MCTSIB.  Assess neck ROM - general and segmental more formally, try suboccipital release.  Initiate HEP - x1 viewing starting slowly, habituation for head turns/nods, corner balance as indicated  Consulted and Agree with Plan of Care Patient    Patient will benefit from skilled therapeutic intervention in order to improve the following deficits and impairments:  Dizziness, Decreased strength, Impaired perceived functional ability, Pain, Decreased range of motion, Impaired vision/preception  Visit Diagnosis: Dizziness and giddiness  Unsteadiness on feet  Cervicalgia  Muscle weakness (generalized)     Problem List Patient Active  Problem List   Diagnosis Date Noted  .  Postconcussion syndrome 10/27/2019  . Seasonal allergic rhinitis 10/27/2019  . Iron deficiency anemia due to chronic blood loss 11/29/2018  . Severe obesity (BMI >= 40) ([place]) 06/12/2014    Rico Junker, PT, DPT 06/09/20    8:48 PM    [facility] [address] [place], [place], [address] Phone: [phone]   Fax:  [phone]  Name: Brandy Foster MRN: [id number] Date of Birth: 06-24-1976

## 2020-06-09 NOTE — Addendum Note (Signed)
Addended by: Rico Junker on: 06/09/2020 08:56 PM   Modules accepted: Orders

## 2020-06-12 NOTE — Addendum Note (Signed)
Addended by: Rico Junker on: 06/12/2020 07:02 AM   Modules accepted: Orders

## 2020-06-13 ENCOUNTER — Ambulatory Visit: Payer: Medicare Other | Admitting: Physical Therapy

## 2020-06-20 ENCOUNTER — Ambulatory Visit: Payer: Medicare Other | Attending: Physical Medicine and Rehabilitation

## 2020-06-20 DIAGNOSIS — M542 Cervicalgia: Secondary | ICD-10-CM | POA: Insufficient documentation

## 2020-06-20 DIAGNOSIS — R42 Dizziness and giddiness: Secondary | ICD-10-CM | POA: Insufficient documentation

## 2020-06-20 DIAGNOSIS — R2681 Unsteadiness on feet: Secondary | ICD-10-CM | POA: Insufficient documentation

## 2020-06-20 DIAGNOSIS — M6281 Muscle weakness (generalized): Secondary | ICD-10-CM | POA: Insufficient documentation

## 2020-06-21 ENCOUNTER — Telehealth: Payer: Self-pay | Admitting: Hematology

## 2020-06-21 NOTE — Telephone Encounter (Signed)
Rescheduled 07/06 appointment per providers request, patient has been called and notified.

## 2020-06-25 ENCOUNTER — Other Ambulatory Visit: Payer: Medicare Other

## 2020-06-25 ENCOUNTER — Ambulatory Visit: Payer: Medicare Other | Admitting: Hematology

## 2020-06-26 ENCOUNTER — Ambulatory Visit: Payer: Medicare Other

## 2020-06-28 ENCOUNTER — Other Ambulatory Visit: Payer: Self-pay

## 2020-06-28 ENCOUNTER — Ambulatory Visit: Payer: Medicare Other

## 2020-06-28 DIAGNOSIS — R2681 Unsteadiness on feet: Secondary | ICD-10-CM

## 2020-06-28 DIAGNOSIS — R42 Dizziness and giddiness: Secondary | ICD-10-CM | POA: Diagnosis not present

## 2020-06-28 DIAGNOSIS — M6281 Muscle weakness (generalized): Secondary | ICD-10-CM | POA: Diagnosis not present

## 2020-06-28 DIAGNOSIS — M542 Cervicalgia: Secondary | ICD-10-CM | POA: Diagnosis not present

## 2020-06-28 NOTE — Patient Instructions (Signed)
Access Code: [email or web address] URL: [email or web address] Date: 06/28/2020 Prepared by: Baldomero Lamy  Exercises Seated Gaze Stabilization with Head Rotation - 1 x daily - 5 x weekly - 3 reps - for 30 secs - 1 minute hold

## 2020-06-28 NOTE — Therapy (Signed)
[facility] [address] [place], [place], [address] Phone: [phone]   Fax:  [phone]  Physical Therapy Treatment  Patient Details  Name: Brandy Foster MRN: [id number] Date of Birth: 1976-08-16 Referring Provider (PT): Shuping, Rockney Ghee, MD   Encounter Date: 06/28/2020   PT End of Session - 06/28/20 1436    Visit Number 2    Number of Visits 7    Date for PT Re-Evaluation 07/22/20    Authorization Type Medicare/Medicaid    PT Start Time 2376   pt arriving late   PT Stop Time 1436   pt needing to leave early due to another medical appt   PT Time Calculation (min) 24 min    Activity Tolerance Patient tolerated treatment well    Behavior During Therapy [facility] for tasks assessed/performed           Past Medical History:  Diagnosis Date  . Anemia   . Anxiety   . Chronic mental illness 06/19/2011   Pt states that she is on disability 2/2 mental illness but she doesn't know what her diagnosis is.  Pt is to request records from her psychiatrist.    . Chronic mental illness 06/19/2011   Pt states that she is on disability 2/2 mental illness but she doesn't know what her diagnosis is.  Pt is to request records from her psychiatrist.    . Depression   . Obesity     Past Surgical History:  Procedure Laterality Date  . CESAREAN SECTION      There were no vitals filed for this visit.   Subjective Assessment - 06/28/20 1414    Subjective Patient reports she has had three episodes of dizziness since her initial evaluations. Reports that she has not had many headaches since then.    Pertinent History anemia, anxiety, chronic mental illness, depression, obesity    Diagnostic tests Had a CT the night of the fall.  Is scheduled for an MRI.    Patient Stated Goals to feel better    Currently in Pain? Yes    Pain Score 3     Pain Location Back    Pain Orientation Lower    Pain Descriptors / Indicators Aching    Pain  Onset More than a month ago              [facility] PT Assessment - 06/28/20 1418      ROM / Strength   AROM / PROM / Strength AROM      AROM   Overall AROM Comments no pain with any cervical AROM    AROM Assessment Site Cervical    Cervical Flexion 21    Cervical Extension 16    Cervical - Right Rotation 34    Cervical - Left Rotation 36      Palpation   Palpation comment With palpation to cervical paraspinals, patietn demo increased muscle tension noted in B suboccipitals, B levator scap, and B upper trap.      High Level Balance   High Level Balance Comments Completed the M-CTSIB: patient able to hold situation 1 and 2 for full 30 seconds. Patient able to hold situation 3 for on avg. 25 seconds, and hold situation 4 for on avg. 20 seconds. Patient demo most dificulty with situation 4 today.                           Vestibular Treatment/Exercise -  06/28/20 0001      Vestibular Treatment/Exercise   Vestibular Treatment Provided Gaze    Gaze Exercises X1 Viewing Horizontal;X1 Viewing Vertical      X1 Viewing Horizontal   Foot Position seated    Reps 2    Comments reps x 30 - 45 secs. verbal cues for technique. dizziness increased 3/10 with completion of horizontal head turns.       X1 Viewing Vertical   Foot Position seated     Reps 1    Comments reps x 30 secs.                  PT Education - 06/28/20 1441    Education Details educated on initial HEP (VOR x 1)    Person(s) Educated Patient    Methods Explanation;Demonstration;Handout    Comprehension Verbalized understanding;Returned demonstration            PT Short Term Goals - 06/09/20 2038      PT SHORT TERM GOAL #1   Title Pt will participate in full assesment of cervical spine and will initiate HEP to address ROM limitations and pain    Time 3    Period Weeks    Status New    Target Date 06/30/20      PT SHORT TERM GOAL #2   Title Pt will participate in further assessment of  vestibular impairments and will initiate vestibular HEP    Baseline need to assess DVA, SOT or MCTSIB, hallpike-dix    Time 3    Period Weeks    Status New    Target Date 06/30/20             PT Long Term Goals - 06/09/20 2043      PT LONG TERM GOAL #1   Title Pt will demonstrate independence with final vestibular, balance and c-spine HEP    Time 6    Period Weeks    Status New    Target Date 07/24/20      PT LONG TERM GOAL #2   Title Pt will demonstrate 10-12 deg increase in cervical spine ROM and report 50% reduction in headaches    Baseline TBD ROM    Time 6    Period Weeks    Status New    Target Date 07/24/20      PT LONG TERM GOAL #3   Title SOT or MCTSIB goal as indicated    Baseline TBD    Time 6    Period Weeks    Status New    Target Date 07/24/20      PT LONG TERM GOAL #4   Title Pt will report no dizziness with supine <> sit or with repeated head turns/nods    Baseline mild-moderate dizziness    Time 6    Period Weeks    Status New    Target Date 07/24/20      PT LONG TERM GOAL #5   Title Pt will demonstrate 2 line difference on DVA indicating improved use of VOR    Baseline TBD    Time 6    Period Weeks    Status New    Target Date 07/24/20                 Plan - 06/28/20 1441    Clinical Impression Statement Session limited today due to patient arriving late and needing to leave early due to another medical appointment. Patient demonstrates decreased vervical flexion/extension, however no  pain. Completed M-CTSIB with patient demo increased difficulty with situation 3 and 4, demo decreased balance on complaint surfaces. Initiated HEP focused on VOR x 1 with horizontal head turns. Pt will continue to benefit from skilled PT services to progress toward all goals.    Personal Factors and Comorbidities Comorbidity 3+;Fitness;Time since onset of injury/illness/exacerbation    Comorbidities anemia, anxiety, chronic mental illness, depression,  obesity    Examination-Activity Limitations Stand;Bed Mobility    Stability/Clinical Decision Making Stable/Uncomplicated    Rehab Potential Good    PT Frequency 1x / week    PT Duration 6 weeks    PT Treatment/Interventions ADLs/Self Care Home Management;Canalith Repostioning;Cryotherapy;Moist Heat;Functional mobility training;Therapeutic activities;Therapeutic exercise;Balance training;Neuromuscular re-education;Patient/family education;Manual techniques;Passive range of motion;Dry needling;Vestibular;Visual/perceptual remediation/compensation    PT Next Visit Plan Get visit report from neuro-ophthalmology.  Assess Hallpike-dix and DVA.  SOT or MCTSIB.  Assess neck ROM - general and segmental more formally, try suboccipital release.  Initiate HEP - x1 viewing starting slowly, habituation for head turns/nods, corner balance as indicated    Consulted and Agree with Plan of Care Patient           Patient will benefit from skilled therapeutic intervention in order to improve the following deficits and impairments:  Dizziness, Decreased strength, Impaired perceived functional ability, Pain, Decreased range of motion, Impaired vision/preception  Visit Diagnosis: Dizziness and giddiness  Unsteadiness on feet  Muscle weakness (generalized)  Cervicalgia     Problem List Patient Active Problem List   Diagnosis Date Noted  . Postconcussion syndrome 10/27/2019  . Seasonal allergic rhinitis 10/27/2019  . Iron deficiency anemia due to chronic blood loss 11/29/2018  . Severe obesity (BMI >= 40) ([place]) 06/12/2014    Jones Bales, PT, DPT 06/28/2020, 2:44 PM  [facility] [address] [place], [place], [address] Phone: [phone]   Fax:  [phone]  Name: Brandy Foster MRN: [id number] Date of Birth: [birth year]

## 2020-07-04 ENCOUNTER — Ambulatory Visit: Payer: Medicare Other

## 2020-07-10 ENCOUNTER — Ambulatory Visit: Payer: Medicare Other

## 2020-07-10 ENCOUNTER — Other Ambulatory Visit: Payer: Self-pay

## 2020-07-10 DIAGNOSIS — R2681 Unsteadiness on feet: Secondary | ICD-10-CM | POA: Diagnosis not present

## 2020-07-10 DIAGNOSIS — R42 Dizziness and giddiness: Secondary | ICD-10-CM

## 2020-07-10 DIAGNOSIS — M542 Cervicalgia: Secondary | ICD-10-CM | POA: Diagnosis not present

## 2020-07-10 DIAGNOSIS — M6281 Muscle weakness (generalized): Secondary | ICD-10-CM

## 2020-07-10 NOTE — Therapy (Signed)
[facility] [address] [place], [place], [address] Phone: [phone]   Fax:  [phone]  Physical Therapy Treatment  Patient Details  Name: Brandy Foster MRN: [id number] Date of Birth: 02-Apr-1976 Referring Provider (PT): Shuping, Rockney Ghee, MD   Encounter Date: 07/10/2020   PT End of Session - 07/10/20 1444    Visit Number 3    Number of Visits 7    Date for PT Re-Evaluation 07/22/20    Authorization Type Medicare/Medicaid    PT Start Time 1410   pt arriving late   PT Stop Time 1445    PT Time Calculation (min) 35 min    Activity Tolerance Patient tolerated treatment well    Behavior During Therapy [facility] for tasks assessed/performed           Past Medical History:  Diagnosis Date  . Anemia   . Anxiety   . Chronic mental illness 06/19/2011   Pt states that she is on disability 2/2 mental illness but she doesn't know what her diagnosis is.  Pt is to request records from her psychiatrist.    . Chronic mental illness 06/19/2011   Pt states that she is on disability 2/2 mental illness but she doesn't know what her diagnosis is.  Pt is to request records from her psychiatrist.    . Depression   . Obesity     Past Surgical History:  Procedure Laterality Date  . CESAREAN SECTION      There were no vitals filed for this visit.   Subjective Assessment - 07/10/20 1411    Subjective Patient still some dizziness spells, along with 1-2 headaches. Did not make it to visit with neuro-opthamalogy, rescheduled for Sept.    Pertinent History anemia, anxiety, chronic mental illness, depression, obesity    Diagnostic tests Had a CT the night of the fall.  Is scheduled for an MRI.    Patient Stated Goals to feel better    Currently in Pain? Yes    Pain Score 6     Pain Location Back    Pain Orientation Lower    Pain Descriptors / Indicators Aching    Pain Type Chronic pain    Pain Onset More than a month ago     Aggravating Factors  bending over, picking up objects    Pain Relieving Factors tylenol                   Vestibular Assessment - 07/10/20 0001      Visual Acuity   Static 11    Dynamic 10   mild dizziness, reports "swimmy headed feeling" & blurriness     Orthostatics   BP sitting 108/72    BP standing (after 1 minute) 114/70    Orthostatics Comment due to patient reports of lightheadedness with standing, completely reassement of orthostatics today. With completion, no reports of lightheaded from sitting to standing and normal vital response.                     [facility] Adult PT Treatment/Exercise - 07/10/20 0001      Ambulation/Gait   Ambulation/Gait Yes    Ambulation/Gait Assistance 7: Independent    Gait Pattern Within Functional Limits    Gait Comments ambulation with activites during therapy session      Exercises   Exercises Other Exercises    Other Exercises  Completed seated assisted cervical rotation with towel, 2 x 5 reps in each  direction, with hold of 20 seconds for proper stretch. PT educating on proper completion as addition for HEP.             Vestibular Treatment/Exercise - 07/10/20 0001      Vestibular Treatment/Exercise   Vestibular Treatment Provided Gaze    Gaze Exercises X1 Viewing Horizontal;X1 Viewing Vertical      X1 Viewing Horizontal   Foot Position seated without back support    Reps 2    Comments x 30 seconds, increase in dizziness to 4/10 after 30 seconds. Educated on rest breaks      X1 Viewing Vertical   Foot Position seated    Reps 2    Comments completed x 30 seconds, today reports that vertical was more difficulty than horizontal. more difficulty concentrating and increased dizziness (6/10). Intermittent rest breaks needed to let dizziness resolve.                  PT Education - 07/10/20 1446    Education Details educated on HEP Update (see patient instructions for details)    Person(s) Educated Patient     Methods Explanation;Demonstration;Handout    Comprehension Verbalized understanding;Returned demonstration            PT Short Term Goals - 07/10/20 1620      PT SHORT TERM GOAL #1   Title Pt will participate in full assesment of cervical spine and will initiate HEP to address ROM limitations and pain    Baseline HEP initiated and cervical spine assessed    Time 3    Period Weeks    Status Achieved    Target Date 06/30/20      PT SHORT TERM GOAL #2   Title Pt will participate in further assessment of vestibular impairments and will initiate vestibular HEP    Baseline DVA and M-CTSIB assessed, Hallpike still need to be assessed.    Time 3    Period Weeks    Status Partially Met    Target Date 06/30/20             PT Long Term Goals - 06/09/20 2043      PT LONG TERM GOAL #1   Title Pt will demonstrate independence with final vestibular, balance and c-spine HEP    Time 6    Period Weeks    Status New    Target Date 07/24/20      PT LONG TERM GOAL #2   Title Pt will demonstrate 10-12 deg increase in cervical spine ROM and report 50% reduction in headaches    Baseline TBD ROM    Time 6    Period Weeks    Status New    Target Date 07/24/20      PT LONG TERM GOAL #3   Title SOT or MCTSIB goal as indicated    Baseline TBD    Time 6    Period Weeks    Status New    Target Date 07/24/20      PT LONG TERM GOAL #4   Title Pt will report no dizziness with supine <> sit or with repeated head turns/nods    Baseline mild-moderate dizziness    Time 6    Period Weeks    Status New    Target Date 07/24/20      PT LONG TERM GOAL #5   Title Pt will demonstrate 2 line difference on DVA indicating improved use of VOR    Baseline TBD  Time 6    Period Weeks    Status New    Target Date 07/24/20                 Plan - 07/10/20 1618    Clinical Impression Statement Assessed DVA today with patient having 1 line difference, but reports of mild dizziness and  bluriness with completion. Continued completion of horizontal/vertical VOR x 1 in seated position, with rest breaks as needed to let symptoms resolve. Educated on completion of cervical rotation with towel and added to HEP. Patient will continue to benefit from skilled PT services to progress toward all goals.    Personal Factors and Comorbidities Comorbidity 3+;Fitness;Time since onset of injury/illness/exacerbation    Comorbidities anemia, anxiety, chronic mental illness, depression, obesity    Examination-Activity Limitations Stand;Bed Mobility    Stability/Clinical Decision Making Stable/Uncomplicated    Rehab Potential Good    PT Frequency 1x / week    PT Duration 6 weeks    PT Treatment/Interventions ADLs/Self Care Home Management;Canalith Repostioning;Cryotherapy;Moist Heat;Functional mobility training;Therapeutic activities;Therapeutic exercise;Balance training;Neuromuscular re-education;Patient/family education;Manual techniques;Passive range of motion;Dry needling;Vestibular;Visual/perceptual remediation/compensation    PT Next Visit Plan Get visit report from neuro-ophthalmology.  Assess Hallpike-dix and DVA.  SOT or MCTSIB.  Assess neck ROM - general and segmental more formally, try suboccipital release.  Initiate HEP - x1 viewing starting slowly, habituation for head turns/nods, corner balance as indicated    Consulted and Agree with Plan of Care Patient           Patient will benefit from skilled therapeutic intervention in order to improve the following deficits and impairments:  Dizziness, Decreased strength, Impaired perceived functional ability, Pain, Decreased range of motion, Impaired vision/preception  Visit Diagnosis: Dizziness and giddiness  Unsteadiness on feet  Muscle weakness (generalized)  Cervicalgia     Problem List Patient Active Problem List   Diagnosis Date Noted  . Postconcussion syndrome 10/27/2019  . Seasonal allergic rhinitis 10/27/2019  . Iron  deficiency anemia due to chronic blood loss 11/29/2018  . Severe obesity (BMI >= 40) ([place]) 06/12/2014    Jones Bales, PT, DPT 07/10/2020, 4:23 PM  [facility] [address] [place], [place], [address] Phone: [phone]   Fax:  [phone]  Name: RIELYNN TRULSON MRN: [id number] Date of Birth: March 12, 1976

## 2020-07-10 NOTE — Progress Notes (Incomplete)
HEMATOLOGY/ONCOLOGY CONSULTATION NOTE  Date of Service: 07/12/2020  Patient Care Team: Danna Hefty, DO as PCP - General (Family Medicine)   CHIEF COMPLAINTS/PURPOSE OF CONSULTATION:  Iron deficiency anemia   HISTORY OF PRESENTING ILLNESS:  Brandy Foster is a wonderful 44 y.o. female who has been referred to [place] by Dr. Harriet Butte for evaluation and management of Iron deficiency anemia. The pt reports that she is doing well overall.   The pt notes that she currently feels tired, and denies any overt light headedness or dizziness.   However, the pt reports that she has had sensations that she is about to pass out for a few months, and that this was worse in the summertime. She notes that she hasn't felt as though she was going to pass out recently. She notes she has intermittently taken PO Iron replacement for several years. She is taking one pill of 325mg  Ferrous Sulfate a day, but forgets some days, and takes it with food, but notes that she has some abdominal discomfort with PO Iron replacement. The pt notes that she has ice cravings.  The pt notes that she has heavy periods, which has only been the case for the past few months. She notes that she uses two pads simultaneously for the first couple days and her periods last about 5 days. The pt notes that she last saw her OBGYN about a year ago. She notes that she had small fibroids 10 years ago, which she discovered during her second pregnancy. She has had three pregnancies, two successful pregnancies 10 and 21 years ago, and one miscarriage a "few years ago." She denies any recent surgeries, and denies any blood in the stools or other blood loss.   The pt denies any dietary restrictions. She denies taking any acid suppressant medication.   The pt notes that she has glaucoma and uses nightly eye drops. She denies any other medical problems.   Most recent lab results (09/07/18) of CBC w/diff is as follows: all values are WNL  except for RBC at 3.63, HGB at 7.7, HCT at 26.4, MCV at 73, MCH at 21.2, MCHC at 29.2, RDW at 16.1. 09/15/18 Anemia Panel revealed all values WNL except for Iron at 20, Iron Saturation at 5%, HCT at 276, Ferritin at 3.   On review of systems, pt reports feeling tired, heavy periods, and denies blood in the stools, current light headedness or dizziness, black stools, abdominal pains, leg swelling, and any other symptoms.   On PMHx the pt reports two cesarean sections 21 and 10 years ago, Glaucoma. Denies liver problems.  On Family Hx the pt reports iron deficiency anemia and denies sickle cell disease, blood disorders   INTERVAL HISTORY:  Brandy Foster is here today for follow up and treatment of her Iron Deficiency Anemia. The patient's last visit with [place] was on 08/03/2019. The pt reports that she is doing well overall.  The pt reports ***  Lab results today (07/12/20) of CBC w/diff and CMP is as follows: all values are WNL except for *** 07/12/20 of Iron and TIBC is as follows: all values are WNL except for *** 07/12/20 of Ferritin at ***  On review of systems, pt reports *** and denies ***and any other symptoms.   A&P: -Discussed pt labwork today, 07/12/20; *** -***  -Patient is significantly iron deficient, likely related to her menorrhagia -Advised that the pt follow up with OBGYN for further evaluation of her menorrhagia and history  of fibroids -Will order IV Injectafer x weekly for 2 doses  -Recommend that the pt begin taking a Vitamin B complex OTC -Will see back in ***   MEDICAL HISTORY:  Past Medical History:  Diagnosis Date  . Anemia   . Anxiety   . Chronic mental illness 06/19/2011   Pt states that she is on disability 2/2 mental illness but she doesn't know what her diagnosis is.  Pt is to request records from her psychiatrist.    . Chronic mental illness 06/19/2011   Pt states that she is on disability 2/2 mental illness but she doesn't know what her diagnosis is.   Pt is to request records from her psychiatrist.    . Depression   . Obesity     SURGICAL HISTORY: Past Surgical History:  Procedure Laterality Date  . CESAREAN SECTION      SOCIAL HISTORY: Social History   Socioeconomic History  . Marital status: Single    Spouse name: Not on file  . Number of children: Not on file  . Years of education: Not on file  . Highest education level: Not on file  Occupational History  . Not on file  Tobacco Use  . Smoking status: Never Smoker  . Smokeless tobacco: Never Used  Substance and Sexual Activity  . Alcohol use: No  . Drug use: No  . Sexual activity: Yes    Birth control/protection: None  Other Topics Concern  . Not on file  Social History Narrative   On disability- not sure what her disability is- for mental  Health reasons.  Unsure of diagnosis.     Mother gets disability check and gives pt the money. - mom pays bills and then gives her the rest.    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    Lives at rental home- has 2 children (ages 27 (melanie) and 22 [place]))            Social Determinants of Health   Financial Resource Strain:   . Difficulty of Paying Living Expenses:   Food Insecurity:   . Worried About Charity fundraiser in the Last Year:   . Arboriculturist in the Last Year:   Transportation Needs:   . Film/video editor (Medical):   Marland Kitchen Lack of Transportation (Non-Medical):   Physical Activity:   . Days of Exercise per Week:   . Minutes of Exercise per Session:   Stress:   . Feeling of Stress :   Social Connections:   . Frequency of Communication with Friends and Family:   . Frequency of Social Gatherings with Friends and Family:   . Attends Religious Services:   . Active Member of Clubs or Organizations:   . Attends Archivist Meetings:   Marland Kitchen Marital Status:   Intimate Partner Violence:   . Fear of Current  or Ex-Partner:   . Emotionally Abused:   Marland Kitchen Physically Abused:   . Sexually Abused:     FAMILY HISTORY: Family History  Problem Relation Age of Onset  . Diabetes Mother   . COPD Mother   . Arthritis Mother   . Hypertension Mother   . Hyperlipidemia Father   . Schizophrenia Brother     ALLERGIES:  has No Known Allergies.  MEDICATIONS:  Current Outpatient Medications  Medication Sig Dispense Refill  . cetirizine (ZYRTEC) 10 MG tablet Take 1 tablet (10  mg total) by mouth daily as needed for allergies. 30 tablet 11  . ferrous sulfate 325 (65 FE) MG tablet Take 1 tablet (325 mg total) by mouth 2 (two) times daily with a meal. 60 tablet 2  . ferumoxytol (FERAHEME) 510 MG/17ML SOLN injection Inject 17 mLs (510 mg total) into the vein once for 1 dose. 17 mL 0  . fluticasone (FLONASE) 50 MCG/ACT nasal spray Place 2 sprays into both nostrils daily. 16 g 6  . iron polysaccharides (NIFEREX) 150 MG capsule Take 1 capsule (150 mg total) by mouth 2 (two) times daily. 60 capsule 3  . LATANOPROST OP Place 1 drop into both eyes at bedtime.    . naproxen (NAPROSYN) 500 MG tablet Take 1 tablet (500 mg total) by mouth 2 (two) times daily. 20 tablet 0  . polyethylene glycol powder (GLYCOLAX/MIRALAX) powder Take 17 g by mouth as needed for mild constipation or moderate constipation. (Patient not taking: Reported on 06/07/2020) 3350 g 1   No current facility-administered medications for this visit.    REVIEW OF SYSTEMS:   A 10+ POINT REVIEW OF SYSTEMS WAS OBTAINED including neurology, dermatology, psychiatry, cardiac, respiratory, lymph, extremities, GI, GU, Musculoskeletal, constitutional, breasts, reproductive, HEENT.  All pertinent positives are noted in the HPI.  All others are negative.   PHYSICAL EXAMINATION:  . There were no vitals filed for this visit. There were no vitals filed for this visit. There is no height or weight on file to calculate BMI. *** GENERAL:alert, in no acute distress  and comfortable SKIN: no acute rashes, no significant lesions EYES: conjunctiva are pink and non-injected, sclera anicteric OROPHARYNX: MMM, no exudates, no oropharyngeal erythema or ulceration NECK: supple, no JVD LYMPH:  no palpable lymphadenopathy in the cervical, axillary or inguinal regions LUNGS: clear to auscultation b/l with normal respiratory effort HEART: regular rate & rhythm ABDOMEN:  normoactive bowel sounds , non tender, not distended. Extremity: no pedal edema PSYCH: alert & oriented x 3 with fluent speech NEURO: no focal motor/sensory deficits  LABORATORY DATA:  I have reviewed the data as listed  . CBC Latest Ref Rng & Units 08/03/2019 02/02/2019 11/29/2018  WBC 4.0 - 10.5 K/uL 8.7 7.1 9.7  Hemoglobin 12.0 - 15.0 g/dL 10.5(L) 11.3(L) 9.4(L)  Hematocrit 36 - 46 % 35.6(L) 37.3 31.3(L)  Platelets 150 - 400 K/uL 293 251 328    . CMP Latest Ref Rng & Units 08/03/2019 11/29/2018 09/01/2016  Glucose 70 - 99 mg/dL 86 100(H) 108(H)  BUN 6 - 20 mg/dL 10 8 8   Creatinine 0.44 - 1.00 mg/dL 0.79 0.88 0.83  Sodium 135 - 145 mmol/L 137 139 140  Potassium 3.5 - 5.1 mmol/L 4.1 3.9 4.3  Chloride 98 - 111 mmol/L 105 108 108  CO2 22 - 32 mmol/L 23 25 23   Calcium 8.9 - 10.3 mg/dL 9.1 9.0 9.1  Total Protein 6.5 - 8.1 g/dL 6.9 7.1 -  Total Bilirubin 0.3 - 1.2 mg/dL 0.2(L) 0.2(L) -  Alkaline Phos 38 - 126 U/L 55 54 -  AST 15 - 41 U/L 15 12(L) -  ALT 0 - 44 U/L 7 <6 -   . Lab Results  Component Value Date   IRON 41 08/03/2019   TIBC 400 08/03/2019   IRONPCTSAT 10 (L) 08/03/2019   (Iron and TIBC)  Lab Results  Component Value Date   FERRITIN 5 (L) 08/03/2019      RADIOGRAPHIC STUDIES: I have personally reviewed the radiological images as listed and agreed with  the findings in the report. No results found.  ASSESSMENT & PLAN:   44 y.o. female with  1. Severe Iron deficiency anemia - related to menorrhagia  PLAN ***  FOLLOW UP: ***   The total time spent in  the appt was *** minutes and more than 50% was on counseling and direct patient cares.  All of the patient's questions were answered with apparent satisfaction. The patient knows to call the clinic with any problems, questions or concerns.  Sullivan Lone MD MS AAHIVMS [facility] Hematology/Oncology Physician [facility]  (Office):       [phone] (Work cell):  [phone] (Fax):           [phone]  07/12/2020 8:30 AM  I, Dawayne Cirri am acting as a scribe for Dr. Sullivan Lone.   {Add Barista Statement}

## 2020-07-10 NOTE — Patient Instructions (Signed)
Access Code: [email or web address] URL: [email or web address] Date: 07/10/2020 Prepared by: Baldomero Lamy  Exercises Seated Gaze Stabilization with Head Rotation - 1 x daily - 5 x weekly - 3 reps - for 30 secs - 1 minute hold Seated Assisted Cervical Rotation with Towel - 1 x daily - 5 x weekly - 1 sets - 5 reps - 15-20 seconds hold

## 2020-07-12 ENCOUNTER — Other Ambulatory Visit: Payer: Self-pay | Admitting: Hematology

## 2020-07-12 ENCOUNTER — Inpatient Hospital Stay: Payer: Medicare Other | Attending: Hematology

## 2020-07-12 ENCOUNTER — Telehealth: Payer: Self-pay | Admitting: *Deleted

## 2020-07-12 ENCOUNTER — Inpatient Hospital Stay: Payer: Medicare Other | Admitting: Hematology

## 2020-07-12 DIAGNOSIS — D500 Iron deficiency anemia secondary to blood loss (chronic): Secondary | ICD-10-CM

## 2020-07-12 NOTE — Telephone Encounter (Signed)
Called pt to make aware of appt today. Pt stated she didn't have transportation and also had forgotten about appt. Advised to call to reschedule. Message sent to scheduling to call pt to reschedule

## 2020-07-18 ENCOUNTER — Ambulatory Visit: Payer: Medicare Other | Admitting: Physical Therapy

## 2020-07-18 ENCOUNTER — Encounter: Payer: Self-pay | Admitting: Physical Therapy

## 2020-07-18 ENCOUNTER — Other Ambulatory Visit: Payer: Self-pay

## 2020-07-18 DIAGNOSIS — M6281 Muscle weakness (generalized): Secondary | ICD-10-CM

## 2020-07-18 DIAGNOSIS — R42 Dizziness and giddiness: Secondary | ICD-10-CM | POA: Diagnosis not present

## 2020-07-18 DIAGNOSIS — R2681 Unsteadiness on feet: Secondary | ICD-10-CM

## 2020-07-18 DIAGNOSIS — M542 Cervicalgia: Secondary | ICD-10-CM | POA: Diagnosis not present

## 2020-07-18 NOTE — Therapy (Signed)
[facility] [address] [place], [place], [address] Phone: [phone]   Fax:  [phone]  Physical Therapy Treatment  Patient Details  Name: Brandy Foster MRN: [id number] Date of Birth: [birth year] Referring Provider (PT): Shuping, Rockney Ghee, MD   Encounter Date: 07/18/2020   PT End of Session - 07/18/20 1943    Visit Number 4    Number of Visits 7    Date for PT Re-Evaluation 07/22/20    Authorization Type Medicare/Medicaid    PT Start Time 1455    PT Stop Time 1535    PT Time Calculation (min) 40 min    Activity Tolerance Patient tolerated treatment well    Behavior During Therapy [facility] for tasks assessed/performed           Past Medical History:  Diagnosis Date  . Anemia   . Anxiety   . Chronic mental illness 06/19/2011   Pt states that she is on disability 2/2 mental illness but she doesn't know what her diagnosis is.  Pt is to request records from her psychiatrist.    . Chronic mental illness 06/19/2011   Pt states that she is on disability 2/2 mental illness but she doesn't know what her diagnosis is.  Pt is to request records from her psychiatrist.    . Depression   . Obesity     Past Surgical History:  Procedure Laterality Date  . CESAREAN SECTION      There were no vitals filed for this visit.   Subjective Assessment - 07/18/20 1503    Subjective Still having intermittent dizzy spells, felt dizzy just now when she got up from waiting area.  No headache.    Pertinent History anemia, anxiety, chronic mental illness, depression, obesity    Diagnostic tests Had a CT the night of the fall.  Is scheduled for an MRI.    Patient Stated Goals to feel better    Currently in Pain? No/denies    Pain Onset More than a month ago                   Vestibular Assessment - 07/18/20 1518      Positional Testing   Dix-Hallpike Dix-Hallpike Right;Dix-Hallpike Left    Horizontal Canal Testing --       Dix-Hallpike Right   Dix-Hallpike Right Duration 0    Dix-Hallpike Right Symptoms No nystagmus      Dix-Hallpike Left   Dix-Hallpike Left Duration 0    Dix-Hallpike Left Symptoms No nystagmus      Positional Sensitivities   Positional Sensitivities Comments --                    [facility] Adult PT Treatment/Exercise - 07/18/20 1939      Therapeutic Activites    Therapeutic Activities Other Therapeutic Activities    Other Therapeutic Activities Pt continues to report dizziness with sudden sit > stand.  Performed 5 sit <> stand from mat with no dizziness, sit > stand with walking forwards, L turn and then R turn with no dizziness.  Reports it is more of a lightheadedness and can happen after sitting for a long period of time.  Not the same as dizziness when performing x1 viewing.  Had pt sit for >5 minutes on mat while performing habituation exercises and then had pt transition to standing and walking quickly; pt reported mild lightheadedness.  Discussed with pt that this may be more related to BP or  venous return after prolonged sitting.  Recommended that pt try to use LE muscle activation (ankle pumps, LAQ, etc) prior to standing after prolonged sitting or standing for ~20-30 seconds prior to taking a step after prolonged sitting.  This does not appear to be related to dizziness pt is experiencing post-concussion.           Vestibular Treatment/Exercise - 07/18/20 1507      Vestibular Treatment/Exercise   Vestibular Treatment Provided Gaze;Habituation    Habituation Exercises Seated Horizontal Head Turns;Seated Vertical Head Turns;Seated Diagonal Head Turns    Gaze Exercises X1 Viewing Horizontal;X1 Viewing Vertical      Seated Horizontal Head Turns   Number of Reps  5    Symptom Description  no symptoms      Seated Vertical Head Turns   Number of Reps  5    Symptom Description  mild symptoms - provided for home      Seated Diagonal Head Turns   Number of Reps 5     Symptoms Description  mild symptoms - provided for home      X1 Viewing Horizontal   Foot Position seated without back support    Reps 2    Comments 30 seconds, mild symptoms      X1 Viewing Vertical   Foot Position seated without back support    Reps 2    Comments 30 seconds, mild symptoms                 PT Education - 07/18/20 1942    Education Details see TA for education regarding lightheadedness upon standing; added habituation to HEP    Person(s) Educated Patient    Methods Explanation;Demonstration;Handout    Comprehension Verbalized understanding;Returned demonstration            PT Short Term Goals - 07/10/20 1620      PT SHORT TERM GOAL #1   Title Pt will participate in full assesment of cervical spine and will initiate HEP to address ROM limitations and pain    Baseline HEP initiated and cervical spine assessed    Time 3    Period Weeks    Status Achieved    Target Date 06/30/20      PT SHORT TERM GOAL #2   Title Pt will participate in further assessment of vestibular impairments and will initiate vestibular HEP    Baseline DVA and M-CTSIB assessed, Hallpike still need to be assessed.    Time 3    Period Weeks    Status Partially Met    Target Date 06/30/20             PT Long Term Goals - 07/18/20 1950      PT LONG TERM GOAL #1   Title Pt will demonstrate independence with final vestibular, balance and c-spine HEP    Time 6    Period Weeks    Status On-going    Target Date 07/24/20      PT LONG TERM GOAL #2   Title Pt will demonstrate 10-12 deg increase in cervical spine ROM and report 50% reduction in headaches    Baseline TBD ROM    Time 6    Period Weeks    Status New      PT LONG TERM GOAL #3   Title Pt will demonstrate ability to perform conditions 3 and 4 on M-CTSIB x 30 seconds with supervision to indicate improved sensory integration    Baseline 25 seconds condition 3,  20 seconds condition 4    Time 6    Period Weeks     Status Revised    Target Date 07/24/20      PT LONG TERM GOAL #4   Title Pt will report no dizziness with supine <> sit or with repeated head turns/nods    Baseline mild-moderate dizziness    Time 6    Period Weeks    Status On-going    Target Date 07/24/20      PT LONG TERM GOAL #5   Title Pt will demonstrate 2 line difference on DVA indicating improved use of VOR    Baseline 1 line difference    Time 6    Period Weeks    Status Achieved      Additional Long Term Goals   Additional Long Term Goals Yes      PT LONG TERM GOAL #6   Title Pt will demonstrate 10-15 deg improvement in pain free neck movement (flex/ext/rotation)    Baseline 21 flex, 16 ext, 34 R rotation, 36 L rotation    Time 6    Period Weeks    Status New    Target Date 07/24/20                 Plan - 07/18/20 1945    Clinical Impression Statement Continued to assess pt symptoms and progress with HEP.  Pt is negative for positional vertigo but continues to have motion sensitivity with repetitive head movements.  Reviewed x1 viewing and seated habituation exercises and discussed with pt how to gauge symptoms after performing.  Educated pt that she should expect mild dizziness when performing habituation exercises.  Pt also continued to report dizziness upon standing and ambulating.  Attempted to distinguish between patient's symptoms: disequilibrium vs. lightheadedness and provided pt with recommendations to manage lightheadedness.  Due to pt missing 4 visits, more visits added.    Personal Factors and Comorbidities Comorbidity 3+;Fitness;Time since onset of injury/illness/exacerbation    Comorbidities anemia, anxiety, chronic mental illness, depression, obesity    Examination-Activity Limitations Stand;Bed Mobility    Stability/Clinical Decision Making Stable/Uncomplicated    Rehab Potential Good    PT Frequency 1x / week    PT Duration 6 weeks    PT Treatment/Interventions ADLs/Self Care Home  Management;Canalith Repostioning;Cryotherapy;Moist Heat;Functional mobility training;Therapeutic activities;Therapeutic exercise;Balance training;Neuromuscular re-education;Patient/family education;Manual techniques;Passive range of motion;Dry needling;Vestibular;Visual/perceptual remediation/compensation    PT Next Visit Plan (Neuro-op in Sept)  Brooke-since she has missed so many sessions we went ahead and added 4 more 1x/week.  When she returns she will be due for LTG check and recertification (you can send to me to do the recert) and may need a few more visits added.  For HEP: progress x1 viewing to standing, habituation to standing; add corner balance exercises with head turns/compliant surfaces.  Manual therapy or dry needling for neck ROM?    Consulted and Agree with Plan of Care Patient           Patient will benefit from skilled therapeutic intervention in order to improve the following deficits and impairments:  Dizziness, Decreased strength, Impaired perceived functional ability, Pain, Decreased range of motion, Impaired vision/preception  Visit Diagnosis: Dizziness and giddiness  Unsteadiness on feet  Muscle weakness (generalized)  Cervicalgia     Problem List Patient Active Problem List   Diagnosis Date Noted  . Postconcussion syndrome 10/27/2019  . Seasonal allergic rhinitis 10/27/2019  . Iron deficiency anemia due to chronic blood loss 11/29/2018  .  Severe obesity (BMI >= 40) ([place]) 06/12/2014    Rico Junker, PT, DPT 07/18/20    8:00 PM    [facility] [address] [place], [place], [address] Phone: [phone]   Fax:  [phone]  Name: Brandy Foster MRN: [id number] Date of Birth: 1976-12-05

## 2020-07-18 NOTE — Patient Instructions (Signed)
Access Code: [email or web address] URL: [email or web address] Date: 07/18/2020 Prepared by: Misty Stanley  Exercises Seated Gaze Stabilization with Head Rotation - 1 x daily - 5 x weekly - 3 reps - for 30 secs - 1 minute hold Seated Assisted Cervical Rotation with Towel - 1 x daily - 5 x weekly - 1 sets - 5 reps - 15-20 seconds hold Seated Head Nod Vestibular Habituation - 1 x daily - 7 x weekly - 2 sets - 5 reps Vestibular Habituation - Seated Diagonal Head Nod - 1 x daily - 7 x weekly - 2 sets - 5 reps

## 2020-08-05 ENCOUNTER — Ambulatory Visit: Payer: Medicare Other | Attending: Physical Medicine and Rehabilitation

## 2020-08-05 NOTE — Therapy (Signed)
Patient called 5 minutes prior to scheduled session to cancel appointment. Recorded as no show.

## 2020-08-15 ENCOUNTER — Ambulatory Visit: Payer: Medicare Other | Admitting: Physical Therapy

## 2020-08-16 ENCOUNTER — Ambulatory Visit: Payer: Medicare Other | Admitting: Physical Therapy

## 2020-08-21 ENCOUNTER — Ambulatory Visit: Payer: Medicare Other

## 2020-08-28 ENCOUNTER — Ambulatory Visit: Payer: Medicare Other

## 2020-08-29 DIAGNOSIS — R42 Dizziness and giddiness: Secondary | ICD-10-CM | POA: Diagnosis not present

## 2020-08-29 DIAGNOSIS — F0781 Postconcussional syndrome: Secondary | ICD-10-CM | POA: Diagnosis not present

## 2020-08-30 ENCOUNTER — Encounter: Payer: Self-pay | Admitting: Physical Therapy

## 2020-08-30 ENCOUNTER — Ambulatory Visit: Payer: Medicare Other | Attending: Physical Medicine and Rehabilitation | Admitting: Physical Therapy

## 2020-08-30 ENCOUNTER — Other Ambulatory Visit: Payer: Self-pay

## 2020-08-30 DIAGNOSIS — R2681 Unsteadiness on feet: Secondary | ICD-10-CM

## 2020-08-30 DIAGNOSIS — R262 Difficulty in walking, not elsewhere classified: Secondary | ICD-10-CM | POA: Diagnosis not present

## 2020-08-30 DIAGNOSIS — M6281 Muscle weakness (generalized): Secondary | ICD-10-CM | POA: Diagnosis not present

## 2020-08-30 DIAGNOSIS — M542 Cervicalgia: Secondary | ICD-10-CM | POA: Insufficient documentation

## 2020-08-30 DIAGNOSIS — R42 Dizziness and giddiness: Secondary | ICD-10-CM | POA: Diagnosis not present

## 2020-08-30 NOTE — Therapy (Signed)
[facility] [address] [place], [place], [address] Phone: [phone]   Fax:  [phone]  Physical Therapy Treatment  Patient Details  Name: Brandy Foster MRN: [id number] Date of Birth: [birth year] Referring Provider (PT): Shuping, Rockney Ghee, MD   Encounter Date: 08/30/2020   PT End of Session - 08/30/20 2101    Visit Number 5    Number of Visits 9    Date for PT Re-Evaluation 09/29/20    Authorization Type Medicare/Medicaid    Progress Note Due on Visit 10    PT Start Time 0935    PT Stop Time 1015    PT Time Calculation (min) 40 min    Activity Tolerance Patient tolerated treatment well    Behavior During Therapy [facility] for tasks assessed/performed           Past Medical History:  Diagnosis Date  . Anemia   . Anxiety   . Chronic mental illness 06/19/2011   Pt states that she is on disability 2/2 mental illness but she doesn't know what her diagnosis is.  Pt is to request records from her psychiatrist.    . Chronic mental illness 06/19/2011   Pt states that she is on disability 2/2 mental illness but she doesn't know what her diagnosis is.  Pt is to request records from her psychiatrist.    . Depression   . Obesity     Past Surgical History:  Procedure Laterality Date  . CESAREAN SECTION      There were no vitals filed for this visit.   Subjective Assessment - 08/30/20 0941    Subjective Dizziness continues to be very intermittent.  Went to see physician yesterday.  Was negative for orthostasis.  Has a follow up with neuro-op and hematologist this month.  No HA today.  Feels like she is still having issues with balance.    Pertinent History anemia, anxiety, chronic mental illness, depression, obesity    Diagnostic tests Had a CT the night of the fall.  Is scheduled for an MRI.    Patient Stated Goals to feel better    Currently in Pain? Yes    Pain Onset More than a month ago              [facility]  PT Assessment - 08/30/20 0944      Assessment   Medical Diagnosis Postconcussive syndrome    Referring Provider (PT) Shuping, Rockney Ghee, MD    Onset Date/Surgical Date 10/19/19    Hand Dominance Right    Next MD Visit has been referred to neuro-ophthalmology in July    Prior Therapy yes after fall      Precautions   Precautions Other (comment)    Precaution Comments anemia, anxiety, chronic mental illness, depression, obesity      [place]      Prior Function   Level of Independence Independent    Vocation On disability      Observation/Other Assessments   Focus on Therapeutic Outcomes (FOTO)  Not captured on evaluation      ROM / Strength   AROM / PROM / Strength AROM      AROM   Cervical Flexion 25    Cervical Extension 25    Cervical - Right Side Bend 30    Cervical - Left Side Bend 25    Cervical - Right Rotation 50    Cervical -  Left Rotation 40      Functional Gait  Assessment   Gait assessed  Yes    Gait Level Surface Walks 20 ft in less than 5.5 sec, no assistive devices, good speed, no evidence for imbalance, normal gait pattern, deviates no more than 6 in outside of the 12 in walkway width.    Change in Gait Speed Able to change speed, demonstrates mild gait deviations, deviates 6-10 in outside of the 12 in walkway width, or no gait deviations, unable to achieve a major change in velocity, or uses a change in velocity, or uses an assistive device.    Gait with Horizontal Head Turns Performs head turns smoothly with slight change in gait velocity (eg, minor disruption to smooth gait path), deviates 6-10 in outside 12 in walkway width, or uses an assistive device.    Gait with Vertical Head Turns Performs head turns with no change in gait. Deviates no more than 6 in outside 12 in walkway width.    Gait and Pivot Turn Pivot turns safely within 3 sec and stops quickly with no loss of  balance.    Step Over Obstacle Is able to step over 2 stacked shoe boxes taped together (9 in total height) without changing gait speed. No evidence of imbalance.    Gait with Narrow Base of Support Is able to ambulate for 10 steps heel to toe with no staggering.    Gait with Eyes Closed Walks 20 ft, no assistive devices, good speed, no evidence of imbalance, normal gait pattern, deviates no more than 6 in outside 12 in walkway width. Ambulates 20 ft in less than 7 sec.    Ambulating Backwards Walks 20 ft, uses assistive device, slower speed, mild gait deviations, deviates 6-10 in outside 12 in walkway width.   dizziness with walking backwards   Steps Alternating feet, must use rail.   reported some L knee pain and dizziness, descending   Total Score 26    FGA comment: 26/30               Vestibular Assessment - 08/30/20 0949      Balancemaster   Balancemaster Comment M-CTSIB: 30 seconds each condition but with increased sway with eyes closed on foam.      Positional Sensitivities   Nose to Right Knee Lightheadedness    Right Knee to Sitting Lightedness    Nose to Left Knee Lightheadedness    Left Knee to Sitting Lightheadedness    Head Turning x 5 No dizziness    Head Nodding x 5 No dizziness    Pivot Right in Standing No dizziness    Pivot Left in Standing No dizziness                            PT Education - 08/30/20 2101    Education Details progress towards goals and areas to continue to address    Person(s) Educated Patient    Methods Explanation    Comprehension Verbalized understanding            PT Short Term Goals - 07/10/20 1620      PT SHORT TERM GOAL #1   Title Pt will participate in full assesment of cervical spine and will initiate HEP to address ROM limitations and pain    Baseline HEP initiated and cervical spine assessed    Time 3    Period Weeks    Status Achieved  Target Date 06/30/20      PT SHORT TERM GOAL #2   Title Pt  will participate in further assessment of vestibular impairments and will initiate vestibular HEP    Baseline DVA and M-CTSIB assessed, Hallpike still need to be assessed.    Time 3    Period Weeks    Status Partially Met    Target Date 06/30/20             PT Long Term Goals - 08/30/20 0958      PT LONG TERM GOAL #1   Title Pt will demonstrate independence with final vestibular, balance and c-spine HEP    Time 6    Period Weeks    Status On-going      PT LONG TERM GOAL #2   Title Pt will demonstrate 10-12 deg increase in cervical spine ROM and report 50% reduction in headaches    Time 6    Period Weeks    Status Partially Met      PT LONG TERM GOAL #3   Title Pt will demonstrate ability to perform conditions 3 and 4 on M-CTSIB x 30 seconds with supervision to indicate improved sensory integration    Baseline 25 seconds condition 3, 20 seconds condition 4    Time 6    Period Weeks    Status Achieved      PT LONG TERM GOAL #4   Title Pt will report no dizziness with supine <> sit or with repeated head turns/nods    Baseline no dizziness with head turns/nods; bed mobility not assessed today    Time 6    Period Weeks    Status Partially Met      PT LONG TERM GOAL #5   Title Pt will demonstrate 2 line difference on DVA indicating improved use of VOR    Baseline 1 line difference    Time 6    Period Weeks    Status Achieved      PT LONG TERM GOAL #6   Title Pt will demonstrate 10-15 deg improvement in pain free neck movement (flex/ext/rotation)    Baseline 21 flex, 16 ext, 34 R rotation, 36 L rotation > 25/25, 30/25, 50/40    Time 6    Period Weeks    Status Partially Met           New goals for recertification:   PT Short Term Goals - 08/30/20 2121      PT SHORT TERM GOAL #1   Title = LTG           PT Long Term Goals - 08/30/20 2122      PT LONG TERM GOAL #1   Title Pt will demonstrate independence with final vestibular, balance and c-spine HEP     Time 4    Period Weeks    Status Revised    Target Date 09/29/20      PT LONG TERM GOAL #2   Title Pt will demonstrate 10 deg increase in flexion, 5 deg increase in L lateral flexion, 10 deg increase in rotation to L and report 50% reduction in headaches    Baseline 25/25 flex/ext; 30/25 lat flexion; 50/40 rotation    Time 4    Period Weeks    Status Revised    Target Date 09/29/20      PT LONG TERM GOAL #3   Title Pt will increase FGA by 2 points to indicate decreased falls risk    Baseline 26/30  Time 4    Period Weeks    Status Revised    Target Date 09/29/20      PT LONG TERM GOAL #4   Title Pt will report no dizziness with supine <> sit and reaching down to the floor    Baseline no dizziness with head turns/nods; bed mobility not assessed today    Time 4    Period Weeks    Status Revised    Target Date 09/29/20                Plan - 08/30/20 2102    Clinical Impression Statement Performed assessment of progress towards LTG.  Pt is making steady progress and has met 2/6 LTG, partially met 3 goals with one goal ongoing.  Pt demonstrates improvement in use of VOR, decreased motion sensitivity, improved sensory integration for balance and improved cervical ROM but continues to demonstrate decreased ROM to L.  Performed assessment of patient's fall risk during dynamic gait.  Pt demonstrates low falls risk but continues to experience dizziness with backwards gait, head turns and when descending stairs.  Pt will benefit from continued skilled PT services to address ongoing impairments to maximize funtional mobility independence and decrease falls risk.    Personal Factors and Comorbidities Comorbidity 3+;Fitness;Time since onset of injury/illness/exacerbation    Comorbidities anemia, anxiety, chronic mental illness, depression, obesity    Examination-Activity Limitations Stand;Bed Mobility    Stability/Clinical Decision Making Stable/Uncomplicated    Rehab Potential --     PT Frequency 1x / week    PT Duration 4 weeks    PT Treatment/Interventions ADLs/Self Care Home Management;Canalith Repostioning;Cryotherapy;Moist Heat;Functional mobility training;Therapeutic activities;Therapeutic exercise;Balance training;Neuromuscular re-education;Patient/family education;Manual techniques;Passive range of motion;Dry needling;Vestibular    PT Next Visit Plan (Neuro-op in Sept).  For HEP: progress x1 viewing to standing, retro gait, balance with head turns, stair negotiation; add corner balance exercises with head turns/compliant surfaces.  Address neck tightness on R to increase ROM to L.    Consulted and Agree with Plan of Care Patient           Patient will benefit from skilled therapeutic intervention in order to improve the following deficits and impairments:  Dizziness, Decreased strength, Impaired perceived functional ability, Pain, Decreased range of motion, Impaired vision/preception  Visit Diagnosis: Dizziness and giddiness  Unsteadiness on feet  Muscle weakness (generalized)  Cervicalgia  Difficulty in walking, not elsewhere classified     Problem List Patient Active Problem List   Diagnosis Date Noted  . Postconcussion syndrome 10/27/2019  . Seasonal allergic rhinitis 10/27/2019  . Iron deficiency anemia due to chronic blood loss 11/29/2018  . Severe obesity (BMI >= 40) ([place]) 06/12/2014   Rico Junker, PT, DPT 08/30/20    9:28 PM    [facility] [address] [place], [place], [address] Phone: [phone]   Fax:  [phone]  Name: Brandy Foster MRN: [id number] Date of Birth: August 09, 1976

## 2020-09-03 ENCOUNTER — Ambulatory Visit: Payer: Medicare Other

## 2020-09-03 ENCOUNTER — Other Ambulatory Visit: Payer: Self-pay

## 2020-09-03 DIAGNOSIS — R262 Difficulty in walking, not elsewhere classified: Secondary | ICD-10-CM | POA: Diagnosis not present

## 2020-09-03 DIAGNOSIS — R2681 Unsteadiness on feet: Secondary | ICD-10-CM | POA: Diagnosis not present

## 2020-09-03 DIAGNOSIS — M6281 Muscle weakness (generalized): Secondary | ICD-10-CM | POA: Diagnosis not present

## 2020-09-03 DIAGNOSIS — M542 Cervicalgia: Secondary | ICD-10-CM

## 2020-09-03 DIAGNOSIS — R42 Dizziness and giddiness: Secondary | ICD-10-CM

## 2020-09-03 NOTE — Patient Instructions (Signed)
Access Code: [email or web address] URL: [email or web address] Date: 09/03/2020 Prepared by: Baldomero Lamy  Exercises Seated Assisted Cervical Rotation with Towel - 1 x daily - 5 x weekly - 1 sets - 5 reps - 15-20 seconds hold Seated Head Nod Vestibular Habituation - 1 x daily - 7 x weekly - 2 sets - 5 reps Vestibular Habituation - Seated Diagonal Head Nod - 1 x daily - 7 x weekly - 2 sets - 5 reps Standing Gaze Stabilization with Head Rotation - 1 x daily - 7 x weekly - 1 sets - 3 reps - 30-45 seconds hold Walking with Head Rotation - 1 x daily - 7 x weekly - 3 sets - 10 reps Romberg Stance Eyes Closed on Foam Pad - 1 x daily - 7 x weekly - 1 sets - 3 reps - 30 hold Romberg Stance on Foam Pad with Head Rotation - 1 x daily - 7 x weekly - 2 sets - 10 reps Tandem Stance with Eyes Closed in Corner - 1 x daily - 7 x weekly - 1 sets - 3 reps - 20-30 hold

## 2020-09-03 NOTE — Therapy (Signed)
[facility] [address] [place], [place], [address] Phone: [phone]   Fax:  [phone]  Physical Therapy Treatment  Patient Details  Name: Brandy Foster MRN: [id number] Date of Birth: February 09, 1976 Referring Provider (PT): Shuping, Rockney Ghee, MD   Encounter Date: 09/03/2020   PT End of Session - 09/03/20 1109    Visit Number 6    Number of Visits 9    Date for PT Re-Evaluation 09/29/20    Authorization Type Medicare/Medicaid    Progress Note Due on Visit 10    PT Start Time 1109   pt arriving late   PT Stop Time 1144    PT Time Calculation (min) 35 min    Activity Tolerance Patient tolerated treatment well    Behavior During Therapy [facility] -  for tasks assessed/performed           Past Medical History:  Diagnosis Date  . Anemia   . Anxiety   . Chronic mental illness 06/19/2011   Pt states that she is on disability 2/2 mental illness but she doesn't know what her diagnosis is.  Pt is to request records from her psychiatrist.    . Chronic mental illness 06/19/2011   Pt states that she is on disability 2/2 mental illness but she doesn't know what her diagnosis is.  Pt is to request records from her psychiatrist.    . Depression   . Obesity     Past Surgical History:  Procedure Laterality Date  . CESAREAN SECTION      There were no vitals filed for this visit.   Subjective Assessment - 09/03/20 1110    Subjective Patient reports had a dizzy spell yesterday in her parents home after she had just got out of the car. Reports that she is getting lightheadedness 2-3x/week . No HA.    Pertinent History anemia, anxiety, chronic mental illness, depression, obesity    Diagnostic tests Had a CT the night of the fall.  Is scheduled for an MRI.    Patient Stated Goals to feel better    Currently in Pain? No/denies    Pain Onset More than a month ago                              Vestibular  Treatment/Exercise - 09/03/20 0001      Vestibular Treatment/Exercise   Vestibular Treatment Provided Gaze    Gaze Exercises X1 Viewing Horizontal;X1 Viewing Vertical      X1 Viewing Horizontal   Foot Position standing with feet shoulder width apart    Reps 2    Comments x 45 seconds. mild symptoms noted.       X1 Viewing Vertical   Foot Position standing with feet shoulder width apart    Reps 2    Comments x 45 seconds. no symptoms reported.               Balance Exercises - 09/03/20 0001      Balance Exercises: Standing   Standing Eyes Opened Narrow base of support (BOS);Foam/compliant surface;Limitations    Standing Eyes Opened Limitations completed standing on airex with narrow BOS, and horizontal/vertical head turns 2 x10 reps each. Increased difficulty noted with horizonta > vertical head turns.    Standing Eyes Closed Narrow base of support (BOS);Foam/compliant surface;3 reps;30 secs;Limitations    Standing Eyes Closed Limitations completed narrow BOS on airex with EC, 3 x 30 seconds. increased  sway noted with vision removed    Tandem Stance Eyes open;Intermittent upper extremity support;3 reps;30 secs;Limitations    Tandem Stance Time 15-20 secs. intermittent UE support required and increased balance challenge ntoed    Gait with Head Turns Forward;Intermittent upper extremity support;4 reps;Limitations    Gait with Head Turns Limitations completed x 4 laps, down and back countertop with intermittent UE support. Increased symptoms initially (lightheadedness/dizziness) but able to return to baseline quickly with rest break.     Other Standing Exercises Comments Educated on proper completion of all balance activites in corner in home with chair placed in front for improved safety.            Access Code: [email or web address] URL: [email or web address] Date: 09/03/2020 Prepared by: Baldomero Lamy  Exercises Seated Assisted Cervical Rotation with Towel - 1 x daily - 5  x weekly - 1 sets - 5 reps - 15-20 seconds hold Seated Head Nod Vestibular Habituation - 1 x daily - 7 x weekly - 2 sets - 5 reps Vestibular Habituation - Seated Diagonal Head Nod - 1 x daily - 7 x weekly - 2 sets - 5 reps Standing Gaze Stabilization with Head Rotation - 1 x daily - 7 x weekly - 1 sets - 3 reps - 30-45 seconds hold Walking with Head Rotation - 1 x daily - 7 x weekly - 3 sets - 10 reps Romberg Stance Eyes Closed on Foam Pad - 1 x daily - 7 x weekly - 1 sets - 3 reps - 30 hold Romberg Stance on Foam Pad with Head Rotation - 1 x daily - 7 x weekly - 2 sets - 10 reps Tandem Stance with Eyes Closed in Corner - 1 x daily - 7 x weekly - 1 sets - 3 reps - 20-30 hold    PT Education - 09/03/20 1146    Education Details HEP update    Person(s) Educated Patient    Methods Explanation;Demonstration;Handout    Comprehension Verbalized understanding;Returned demonstration;Need further instruction            PT Short Term Goals - 08/30/20 2121      PT SHORT TERM GOAL #1   Title = LTG             PT Long Term Goals - 08/30/20 2122      PT LONG TERM GOAL #1   Title Pt will demonstrate independence with final vestibular, balance and c-spine HEP    Time 4    Period Weeks    Status Revised    Target Date 09/29/20      PT LONG TERM GOAL #2   Title Pt will demonstrate 10 deg increase in flexion, 5 deg increase in L lateral flexion, 10 deg increase in rotation to L and report 50% reduction in headaches    Baseline 25/25 flex/ext; 30/25 lat flexion; 50/40 rotation    Time 4    Period Weeks    Status Revised    Target Date 09/29/20      PT LONG TERM GOAL #3   Title Pt will increase FGA by 2 points to indicate decreased falls risk    Baseline 26/30    Time 4    Period Weeks    Status Revised    Target Date 09/29/20      PT LONG TERM GOAL #4   Title Pt will report no dizziness with supine <> sit and reaching down to the floor    Baseline no dizziness with  head  turns/nods; bed mobility not assessed today    Time 4    Period Weeks    Status Revised    Target Date 09/29/20                 Plan - 09/03/20 1326    Clinical Impression Statement Today's skilled PT session included progression of VOR x 1 to standing position with patient tolerating well. Mild symptoms with completion horizontally. Completed corner balance activites and gait with head turns. Increased balance challenge noted on complaint surfaces, vision removed and horizontal head turns. Updated HEP to include balance exercises today. Will continue to progress toward goals.    Personal Factors and Comorbidities Comorbidity 3+;Fitness;Time since onset of injury/illness/exacerbation    Comorbidities anemia, anxiety, chronic mental illness, depression, obesity    Examination-Activity Limitations Stand;Bed Mobility    Stability/Clinical Decision Making Stable/Uncomplicated    PT Frequency 1x / week    PT Duration 4 weeks    PT Treatment/Interventions ADLs/Self Care Home Management;Canalith Repostioning;Cryotherapy;Moist Heat;Functional mobility training;Therapeutic activities;Therapeutic exercise;Balance training;Neuromuscular re-education;Patient/family education;Manual techniques;Passive range of motion;Dry needling;Vestibular    PT Next Visit Plan (Neuro-op in Sept).  How was HEP update? completed retro gait, balance with head turns, stair negotiation; add corner balance exercises with head turns/compliant surfaces.  Address neck tightness on R to increase ROM to L.    Consulted and Agree with Plan of Care Patient           Patient will benefit from skilled therapeutic intervention in order to improve the following deficits and impairments:  Dizziness, Decreased strength, Impaired perceived functional ability, Pain, Decreased range of motion, Impaired vision/preception  Visit Diagnosis: Dizziness and giddiness  Unsteadiness on feet  Muscle weakness  (generalized)  Cervicalgia  Difficulty in walking, not elsewhere classified     Problem List Patient Active Problem List   Diagnosis Date Noted  . Postconcussion syndrome 10/27/2019  . Seasonal allergic rhinitis 10/27/2019  . Iron deficiency anemia due to chronic blood loss 11/29/2018  . Severe obesity (BMI >= 40) ([place]) 06/12/2014    Jones Bales, PT, DPT 09/03/2020, 1:28 PM  [facility] [address] [place], [place], [address] Phone: [phone]   Fax:  [phone]  Name: Brandy Foster MRN: [id number] Date of Birth: April 14, 1976

## 2020-09-11 DIAGNOSIS — M25562 Pain in left knee: Secondary | ICD-10-CM | POA: Diagnosis not present

## 2020-09-13 ENCOUNTER — Ambulatory Visit: Payer: Medicare Other

## 2020-09-17 DIAGNOSIS — F0781 Postconcussional syndrome: Secondary | ICD-10-CM | POA: Diagnosis not present

## 2020-09-17 DIAGNOSIS — H539 Unspecified visual disturbance: Secondary | ICD-10-CM | POA: Diagnosis not present

## 2020-09-20 ENCOUNTER — Ambulatory Visit: Payer: Medicare Other | Attending: Physical Medicine and Rehabilitation | Admitting: Physical Therapy

## 2020-09-24 DIAGNOSIS — M7652 Patellar tendinitis, left knee: Secondary | ICD-10-CM | POA: Diagnosis not present

## 2020-09-24 DIAGNOSIS — M25562 Pain in left knee: Secondary | ICD-10-CM | POA: Diagnosis not present

## 2020-10-01 ENCOUNTER — Ambulatory Visit: Payer: Medicare Other | Admitting: Physical Therapy

## 2020-10-21 ENCOUNTER — Ambulatory Visit: Payer: Medicare Other | Attending: Physical Medicine and Rehabilitation

## 2020-10-21 ENCOUNTER — Other Ambulatory Visit: Payer: Self-pay

## 2020-10-21 DIAGNOSIS — R2681 Unsteadiness on feet: Secondary | ICD-10-CM | POA: Insufficient documentation

## 2020-10-21 DIAGNOSIS — R42 Dizziness and giddiness: Secondary | ICD-10-CM | POA: Insufficient documentation

## 2020-10-21 DIAGNOSIS — M542 Cervicalgia: Secondary | ICD-10-CM | POA: Insufficient documentation

## 2020-10-21 DIAGNOSIS — M6281 Muscle weakness (generalized): Secondary | ICD-10-CM | POA: Diagnosis not present

## 2020-10-21 NOTE — Therapy (Signed)
[facility] [address] [place], [place], [address] Phone: [phone]   Fax:  [phone]  Physical Therapy Treatment/Discharge Summary  Patient Details  Name: Brandy Foster MRN: [id number] Date of Birth: 02/26/1976 Referring Provider (PT): Shuping, Lee Thomas, MD  PHYSICAL THERAPY DISCHARGE SUMMARY  Visits from Start of Care: 7  Current functional level related to goals / functional outcomes: See Clinical Impression for Details   Remaining deficits: L Knee Pain, Mild Dizziness and Headaches   Education / Equipment: Educated on HEP; Potential referral to Outpatient Ortho PT  Plan: Patient agrees to discharge.  Patient goals were partially met. Patient is being discharged due to meeting the stated rehab goals.  ?????         Encounter Date: 10/21/2020   PT End of Session - 10/21/20 1505    Visit Number 7    Number of Visits 9    Date for PT Re-Evaluation 09/29/20    Authorization Type Medicare/Medicaid    Progress Note Due on Visit 10    PT Start Time 1500    PT Stop Time 1531    PT Time Calculation (min) 31 min    Activity Tolerance Patient tolerated treatment well    Behavior During Therapy WFL for tasks assessed/performed           Past Medical History:  Diagnosis Date  . Anemia   . Anxiety   . Chronic mental illness 06/19/2011   Pt states that she is on disability 2/2 mental illness but she doesn't know what her diagnosis is.  Pt is to request records from her psychiatrist.    . Chronic mental illness 06/19/2011   Pt states that she is on disability 2/2 mental illness but she doesn't know what her diagnosis is.  Pt is to request records from her psychiatrist.    . Depression   . Obesity     Past Surgical History:  Procedure Laterality Date  . CESAREAN SECTION      There were no vitals filed for this visit.   Subjective Assessment - 10/21/20 1501    Subjective Patient reports that she  did have some pain in the L knee, continues to be sore. Had x-ray completed and imaging was normal. Patient reports been doing well, but has occasional spells of dizziness. Patient reports it is not occuring as freq. No HA.    Pertinent History anemia, anxiety, chronic mental illness, depression, obesity    Diagnostic tests Had a CT the night of the fall.  Is scheduled for an MRI.    Patient Stated Goals to feel better    Currently in Pain? Yes    Pain Score 5     Pain Location Knee    Pain Orientation Left    Pain Descriptors / Indicators Aching;Sore    Pain Type Acute pain    Pain Onset 1 to 4 weeks ago    Pain Frequency Intermittent    Pain Relieving Factors cream              [facility] PT Assessment - 10/21/20 1510      Assessment   Medical Diagnosis Postconcussive syndrome    Referring Provider (PT) Shuping, Lee Thomas, MD      ROM / Strength   AROM / PROM / Strength AROM      AROM   AROM Assessment Site Cervical    Cervical Flexion 33    Cervical Extension 41    Cervical -   Right Side Bend 36    Cervical - Left Side Bend 32    Cervical - Right Rotation 54    Cervical - Left Rotation 48      Functional Gait  Assessment   Gait assessed  Yes    Gait Level Surface Walks 20 ft in less than 5.5 sec, no assistive devices, good speed, no evidence for imbalance, normal gait pattern, deviates no more than 6 in outside of the 12 in walkway width.    Change in Gait Speed Able to smoothly change walking speed without loss of balance or gait deviation. Deviate no more than 6 in outside of the 12 in walkway width.    Gait with Horizontal Head Turns Performs head turns smoothly with no change in gait. Deviates no more than 6 in outside 12 in walkway width    Gait with Vertical Head Turns Performs head turns with no change in gait. Deviates no more than 6 in outside 12 in walkway width.    Gait and Pivot Turn Pivot turns safely within 3 sec and stops quickly with no loss of balance.     Step Over Obstacle Is able to step over 2 stacked shoe boxes taped together (9 in total height) without changing gait speed. No evidence of imbalance.    Gait with Narrow Base of Support Is able to ambulate for 10 steps heel to toe with no staggering.    Gait with Eyes Closed Walks 20 ft, no assistive devices, good speed, no evidence of imbalance, normal gait pattern, deviates no more than 6 in outside 12 in walkway width. Ambulates 20 ft in less than 7 sec.    Ambulating Backwards Walks 20 ft, uses assistive device, slower speed, mild gait deviations, deviates 6-10 in outside 12 in walkway width.    Steps Alternating feet, must use rail.    Total Score 28    FGA comment: 28/30 = Low Fall Risk                 [facility] Adult PT Treatment/Exercise - 10/21/20 0001      Ambulation/Gait   Ambulation/Gait Yes    Ambulation/Gait Assistance 7: Independent    Gait Pattern Within Functional Limits    Gait Comments mild antaglic gait pattern at times due to L knee pain      Self-Care   Self-Care Other Self-Care Comments    Other Self-Care Comments  Due to reports of pain in L knee, PT educating on application of heat/ice to area to further reduce pain. PT also educating on potential need for Ortho PT to address knee concerns.       Therapeutic Activites    Therapeutic Activities Other Therapeutic Activities    Other Therapeutic Activities Completed reaching for objects on ground x 3 reps, no increase in dizziness. Completed supine <> sit and sit <> supine, no increase in dizziness reported.                   PT Education - 10/21/20 1609    Education Details progress toward LTGs; potential need for Outpatient Ortho PT for L Knee Pain. Patient requesting on PT at [facility], address/phone number provided. PT educating on need for referral from MD for L Knee to schedule appt.    Person(s) Educated Patient    Methods Explanation;Handout    Comprehension Verbalized understanding             PT Short Term Goals - 08/30/20 2121  PT SHORT TERM GOAL #1   Title = LTG             PT Long Term Goals - 10/21/20 1507      PT LONG TERM GOAL #1   Title Pt will demonstrate independence with final vestibular, balance and c-spine HEP    Baseline patient  demo/reports independence with HEP    Time 4    Period Weeks    Status Achieved      PT LONG TERM GOAL #2   Title Pt will demonstrate 10 deg increase in flexion, 5 deg increase in L lateral flexion, 10 deg increase in rotation to L and report 50% reduction in headaches    Baseline 25/25 flex/ext; 30/25 lat flexion; 50/40 rotation; 11/1: 33/41 flex/ext, 36/32 lat flexion, 54/48 rotation reports 40% improvement in headaches    Time 4    Period Weeks    Status Partially Met      PT LONG TERM GOAL #3   Title Pt will increase FGA by 2 points to indicate decreased falls risk    Baseline 26/30, 28/30    Time 4    Period Weeks    Status Achieved      PT LONG TERM GOAL #4   Title Pt will report no dizziness with supine <> sit and reaching down to the floor    Baseline no dizziness with completion of activites    Time 4    Period Weeks    Status Achieved                 Plan - 10/21/20 1612    Clinical Impression Statement Today's skilled PT session included assessment of patient's progress toward LTGs. Patient able to meet LTG 1,3 and 4 today during session. Demonstrating improved tolerance for functional task without increase in dizziness, improved balance with FGA score of 28/30 showing reduced fall risk. Patient partially meet LTG #2, patient reporting 40% improvements in headaches, and demonstating improved ROM in cervical spine. Patient main complaint at this time is L knee pain, PT educating on referral to outpatient ortho to address knee concerns. PT verbalizing readiness for discharge, and patient verbalizing agreement.    Personal Factors and Comorbidities Comorbidity 3+;Fitness;Time since onset of  injury/illness/exacerbation    Comorbidities anemia, anxiety, chronic mental illness, depression, obesity    Examination-Activity Limitations Stand;Bed Mobility    Stability/Clinical Decision Making Stable/Uncomplicated    PT Frequency 1x / week    PT Duration 4 weeks    PT Treatment/Interventions ADLs/Self Care Home Management;Canalith Repostioning;Cryotherapy;Moist Heat;Functional mobility training;Therapeutic activities;Therapeutic exercise;Balance training;Neuromuscular re-education;Patient/family education;Manual techniques;Passive range of motion;Dry needling;Vestibular    Consulted and Agree with Plan of Care Patient           Patient will benefit from skilled therapeutic intervention in order to improve the following deficits and impairments:  Dizziness, Decreased strength, Impaired perceived functional ability, Pain, Decreased range of motion, Impaired vision/preception  Visit Diagnosis: Dizziness and giddiness  Unsteadiness on feet  Muscle weakness (generalized)  Cervicalgia     Problem List Patient Active Problem List   Diagnosis Date Noted  . Postconcussion syndrome 10/27/2019  . Seasonal allergic rhinitis 10/27/2019  . Iron deficiency anemia due to chronic blood loss 11/29/2018  . Severe obesity (BMI >= 40) (HCC) 06/12/2014    Kaitlyn B Martin, PT, DPT 10/21/2020, 4:15 PM  [facility] [address] [place], [place], [address] Phone: [phone]   Fax:  [phone]    Name: Brandy Foster MRN: [id number] Date of Birth: 12-25-1975

## 2020-12-11 ENCOUNTER — Ambulatory Visit (INDEPENDENT_AMBULATORY_CARE_PROVIDER_SITE_OTHER): Payer: Medicare Other | Admitting: Family Medicine

## 2020-12-11 ENCOUNTER — Encounter: Payer: Self-pay | Admitting: Family Medicine

## 2020-12-11 ENCOUNTER — Other Ambulatory Visit: Payer: Self-pay

## 2020-12-11 ENCOUNTER — Other Ambulatory Visit (HOSPITAL_COMMUNITY)
Admission: RE | Admit: 2020-12-11 | Discharge: 2020-12-11 | Disposition: A | Discharge status: Home / Self Care | Payer: Medicare Other | Source: Ambulatory Visit | Attending: Family Medicine | Admitting: Family Medicine

## 2020-12-11 VITALS — BP 108/72 | HR 87 | Wt 284.6 lb

## 2020-12-11 DIAGNOSIS — A5901 Trichomonal vulvovaginitis: Secondary | ICD-10-CM | POA: Diagnosis not present

## 2020-12-11 DIAGNOSIS — Z113 Encounter for screening for infections with a predominantly sexual mode of transmission: Secondary | ICD-10-CM

## 2020-12-11 DIAGNOSIS — Z7251 High risk heterosexual behavior: Secondary | ICD-10-CM | POA: Insufficient documentation

## 2020-12-11 DIAGNOSIS — Z114 Encounter for screening for human immunodeficiency virus [HIV]: Secondary | ICD-10-CM

## 2020-12-11 DIAGNOSIS — N898 Other specified noninflammatory disorders of vagina: Secondary | ICD-10-CM | POA: Diagnosis not present

## 2020-12-11 LAB — POCT WET PREP (WET MOUNT): Clue Cells Wet Prep Whiff POC: NEGATIVE

## 2020-12-11 MED ORDER — METRONIDAZOLE 500 MG PO TABS: 500.0000 mg | ORAL_TABLET | Freq: Two times a day (BID) | ORAL | 0 refills | Status: AC

## 2020-12-11 NOTE — Progress Notes (Signed)
   Subjective:   Patient ID: Brandy Foster    DOB: 14-Nov-1976, 44 y.o. female   MRN: [id number]  Brandy Foster is a 44 y.o. female with a history of seasonal allergies, postconcussive syndrome, iron deficiency anemia, severe obesity here for  vaginal itching.  Vaginal Irritation: Patient endorses vaginal discharge and itching x2 or 3 days.  She notes the discharge is white.  She is sexually active with 2-3 female partners in the last year.  She uses condoms intermittently.  She does desire STD testing.  Her last menstrual period was approximately 11/22/2020.  Denies fevers, chills, pelvic pain.  Review of Systems:  Per HPI.   Objective:   BP 108/72   Pulse 87   Wt 284 lb 9.6 oz (129.1 kg)   LMP  (LMP Unknown)   SpO2 99%   BMI 42.64 kg/m  Vitals and nursing note reviewed.  General: pleasant middle-aged woman, sitting comfortably in exam chair, well nourished, well developed, in no acute distress with non-toxic appearance Resp: breathing comfortably on room air, speaking in full sentences MSK: gait normal Neuro: Alert and oriented, speech normal Pelvic exam: VULVA: normal appearing vulva with no masses, tenderness or lesions, exam chaperoned by Birdena Crandall.. **Patient declined speculum exam thus limited pelvic exam**  Assessment & Plan:   Vaginal discharge Trichomonas confirmed on wet prep. GC/Chlamydia/HIV/RPR/Hep C pending. - Treatment with Flagyl 500 BID x 7 days.  - patient instructed to restrain from sexual intercourse until completion of treatment. Partner should be adequately tested and treated prior to further sexual intercourse.  - Urged to use condoms to help prevent future infection. - Recommended retesting in 3 months to evaluate for reinfection / adequate treatment - F/U if symptoms not improving or getting worse.  - F/U with PCP as needed.  - Return precautions including abdominal pain, fever, chills, nausea, or vomiting given.   Screen for STD: - positive for trich  - see plan as above - GC/Cl/HIV/RPR/Hep C pending. Will follow up and treat if indicated  Health maintenance: Due for pap-smear. Patient declined. She plans to follow up in January for annual exam  Orders Placed This Encounter  Procedures  . HIV Antibody (routine testing w rflx)  . RPR  . Hepatitis C antibody  . POCT Wet Prep [facility])   Meds ordered this encounter  Medications  . metroNIDAZOLE (FLAGYL) 500 MG tablet    Sig: Take 1 tablet (500 mg total) by mouth 2 (two) times daily for 7 days.    Dispense:  14 tablet    Refill:  0   Mina Marble, DO PGY-3, [facility] 12/11/2020 1:25 PM

## 2020-12-11 NOTE — Assessment & Plan Note (Signed)
Trichomonas confirmed on wet prep. GC/Chlamydia/HIV/RPR/Hep C pending. - Treatment with Flagyl 500 BID x 7 days.  - patient instructed to restrain from sexual intercourse until completion of treatment. Partner should be adequately tested and treated prior to further sexual intercourse.  - Urged to use condoms to help prevent future infection. - Recommended retesting in 3 months to evaluate for reinfection / adequate treatment - F/U if symptoms not improving or getting worse.  - F/U with PCP as needed.  - Return precautions including abdominal pain, fever, chills, nausea, or vomiting given.

## 2020-12-12 LAB — CERVICOVAGINAL ANCILLARY ONLY
Chlamydia: NEGATIVE
Comment: NEGATIVE
Comment: NEGATIVE
Comment: NORMAL
Neisseria Gonorrhea: NEGATIVE
Trichomonas: POSITIVE — AB

## 2020-12-12 LAB — HEPATITIS C ANTIBODY: Hep C Virus Ab: 0.1 s/co ratio (ref 0.0–0.9)

## 2020-12-12 LAB — RPR: RPR Ser Ql: NONREACTIVE

## 2020-12-12 LAB — HIV ANTIBODY (ROUTINE TESTING W REFLEX): HIV Screen 4th Generation wRfx: NONREACTIVE

## 2020-12-13 ENCOUNTER — Encounter: Payer: Self-pay | Admitting: Family Medicine

## 2020-12-24 ENCOUNTER — Telehealth: Payer: Self-pay

## 2020-12-24 ENCOUNTER — Other Ambulatory Visit: Payer: Self-pay | Admitting: Family Medicine

## 2020-12-24 DIAGNOSIS — N898 Other specified noninflammatory disorders of vagina: Secondary | ICD-10-CM

## 2020-12-24 MED ORDER — FLUCONAZOLE 150 MG PO TABS: ORAL_TABLET | ORAL | 0 refills | Status: DC

## 2020-12-24 NOTE — Telephone Encounter (Signed)
Patient calls nurse line requesting Diflucan for a now yeast infection. Patient reports she finished the full course of Flagyl, but feels she now has a yeast infection. Patient reports itching and clumpy discharge. Please advise.

## 2020-12-24 NOTE — Progress Notes (Deleted)
    SUBJECTIVE:   Chief compliant/HPI: annual examination  Brandy Foster is a 45 y.o. who presents today for an annual exam.   PMH: seasonal allergic rhinitis, postconcussion syndrome, iron deficiency anemia due to chronic blood loss, severe obesity, h/o trich infection  Social History: LMP: *** Contraception: *** Alcohol: Tobacco:  Illicit Drugs: *** Safe at home: *** Depression/Suicidality: ***  Family History of GI or breast cancer: ***  Health Maintenance: Health Maintenance Due  Topic  . COVID-19 Vaccine (1)  . PAP SMEAR-Modifier    History tabs reviewed and updated ***.   Review of systems form reviewed and notable for ***.   OBJECTIVE:   LMP  (LMP Unknown)   General: pleasant ***, sitting comfortably in exam chair, well nourished, well developed, in no acute distress with non-toxic appearance HEENT: normocephalic, atraumatic, moist mucous membranes, oropharynx clear without erythema or exudate, TM normal bilaterally  Neck: supple, non-tender without lymphadenopathy CV: regular rate and rhythm without murmurs, rubs, or gallops, no lower extremity edema, 2+ radial and pedal pulses bilaterally Lungs: clear to auscultation bilaterally with normal work of breathing on room air Resp: breathing comfortably on room air, speaking in full sentences Abdomen: soft, non-tender, non-distended, no masses or organomegaly palpable, normoactive bowel sounds Skin: warm, dry, no rashes or lesions Extremities: warm and well perfused, normal tone MSK: ROM grossly intact, strength intact, gait normal Neuro: Alert and oriented, speech normal  ASSESSMENT/PLAN:   Annual Physical Exam: Patient here today for annual physical exam.  PMH, surgical history, and social history were reviewed. The following concerns below were discussed.   Iron Deficiency Anemia due to chronic blood loss: Has been evaluated by Heme/onc, Dr. Irene Limbo in 07/2019.  IDA felt to be 2/2 to heavy menstrual cycles.  Recommended to follow up with Gyn. Received iron infusion x2.  Has not followed up since. Last iron panel in 2020 notable for Iron 41, TIBC 400, Sat ratio 10, Ferritin 5. Normal Folate. Borderline low B12. Denies any acute blood loss*** Endorses compliance with iron supplement. - obtain anemia panel - repeat B12  - continue iron supplement  No problem-specific Assessment & Plan notes found for this encounter.    Annual Examination  See AVS for age appropriate recommendations.   PHQ score ***, reviewed and discussed.  Blood pressure reviewed and at goal ***.  Asked about intimate partner violence and resources given as appropriate  The patient currently uses *** for contraception. Folate recommended as appropriate, minimum of 400 mcg per day.   Considered the following items based upon USPSTF recommendations: Diabetes screening: ordered Screening for elevated cholesterol: ordered HIV testing: negative 12/11/20 Hepatitis C: Negative 12/11/20 Hepatitis B: ordered Syphilis if at high risk: Negative 12/11/20 GC/CT Testing on 12/11/20 with (+) trich, treated with 7 day course of Metronidazole  Reviewed risk factors for latent tuberculosis and {not indicated/requested/declined:14582} Reviewed risk factors for osteoporosis. Using FRAX tool estimated risk of major osteoporotic fracture of  ***%, early screening {ordered not order:23822::"not ordered"}   Discussed family history, BRCA testing {not indicated/requested/declined:14582}. Tool used to risk stratify was ***.  Cervical cancer screening: due for Pap today, cytology + HPV ordered Breast cancer screening: discussed potential benefits, risks including overdiagnosis and biopsy, elected to wait until age 5 Colorectal cancer screening: not applicable given age.  if age 38 or over.   Follow up in 1 *** year or sooner if indicated.    [place]

## 2020-12-24 NOTE — Telephone Encounter (Signed)
Please inform patient I will call in rx. If no improvement, she will need to follow up for further evaluation.

## 2020-12-25 ENCOUNTER — Ambulatory Visit: Payer: Medicare Other | Admitting: Family Medicine

## 2021-01-08 ENCOUNTER — Ambulatory Visit: Payer: Medicare Other | Admitting: Family Medicine

## 2021-01-12 NOTE — Progress Notes (Deleted)
SUBJECTIVE:   Chief compliant/HPI: annual examination  Brandy Foster is a 45 y.o. who presents today for an annual exam.   PMH: seasonal allergic rhinitis, postconcussion syndrome, iron deficiency anemia due to chronic blood loss, severe obesity, h/o trich infection  Social History: LMP: *** Contraception: *** Alcohol: Tobacco:  Illicit Drugs: *** Safe at home: *** Depression/Suicidality: ***  Family History of GI or breast cancer: ***  Health Maintenance: Health Maintenance Due  Topic  . COVID-19 Vaccine (1)  . PAP SMEAR-Modifier    History tabs reviewed and updated ***.   Review of systems form reviewed and notable for ***.   OBJECTIVE:   There were no vitals taken for this visit.  General: pleasant ***, sitting comfortably in exam chair, well nourished, well developed, in no acute distress with non-toxic appearance HEENT: normocephalic, atraumatic, moist mucous membranes, oropharynx clear without erythema or exudate, TM normal bilaterally  Neck: supple, non-tender without lymphadenopathy CV: regular rate and rhythm without murmurs, rubs, or gallops, no lower extremity edema, 2+ radial and pedal pulses bilaterally Lungs: clear to auscultation bilaterally with normal work of breathing on room air Resp: breathing comfortably on room air, speaking in full sentences Abdomen: soft, non-tender, non-distended, no masses or organomegaly palpable, normoactive bowel sounds Skin: warm, dry, no rashes or lesions Extremities: warm and well perfused, normal tone MSK: ROM grossly intact, strength intact, gait normal Neuro: Alert and oriented, speech normal Pelvic exam: {pelvic exam:315900::"normal external genitalia, vulva, vagina, cervix, uterus and adnexa"}.  ASSESSMENT/PLAN:   Annual Physical Exam: Patient here today for annual physical exam.  PMH, surgical history, and social history were reviewed. The following concerns below were discussed.   Iron Deficiency Anemia  due to chronic blood loss: Has been evaluated by Heme/onc, Dr. Irene Limbo in 07/2019.  IDA felt to be 2/2 to heavy menstrual cycles. Recommended to follow up with Gyn. Received iron infusion x2.  Has not followed up since. Last iron panel in 2020 notable for Iron 41, TIBC 400, Sat ratio 10, Ferritin 5. Normal Folate. Borderline low B12. Denies any acute blood loss*** Endorses compliance with iron supplement. - obtain anemia panel - repeat B12  - continue iron supplement  No problem-specific Assessment & Plan notes found for this encounter.    Annual Examination  See AVS for age appropriate recommendations.   PHQ score ***, reviewed and discussed.  Blood pressure reviewed and at goal ***.  Asked about intimate partner violence and resources given as appropriate  The patient currently uses *** for contraception. Folate recommended as appropriate, minimum of 400 mcg per day.   Considered the following items based upon USPSTF recommendations: Diabetes screening: ordered Screening for elevated cholesterol: ordered HIV testing: negative 12/11/20 Hepatitis C: Negative 12/11/20 Hepatitis B: ordered Syphilis if at high risk: Negative 12/11/20 GC/CT Testing on 12/11/20 with (+) trich, treated with 7 day course of Metronidazole  Reviewed risk factors for latent tuberculosis and {not indicated/requested/declined:14582} Reviewed risk factors for osteoporosis. Using FRAX tool estimated risk of major osteoporotic fracture of  ***%, early screening {ordered not order:23822::"not ordered"}   Discussed family history, BRCA testing {not indicated/requested/declined:14582}. Tool used to risk stratify was ***.  Cervical cancer screening: due for Pap today, cytology + HPV ordered Breast cancer screening: discussed potential benefits, risks including overdiagnosis and biopsy, elected to wait until age 14 Colorectal cancer screening: not applicable given age.  if age 58 or over.   Follow up in 1 *** year or sooner  if indicated.  [place]

## 2021-01-13 ENCOUNTER — Ambulatory Visit: Payer: Medicare Other | Admitting: Family Medicine

## 2021-06-09 ENCOUNTER — Telehealth: Payer: Self-pay | Admitting: Hematology

## 2021-06-09 NOTE — Telephone Encounter (Signed)
R/s appts per 6/20 sch msg. Pt aware.  

## 2021-07-04 ENCOUNTER — Inpatient Hospital Stay: Payer: Medicare Other

## 2021-07-04 ENCOUNTER — Inpatient Hospital Stay: Payer: Medicare Other | Attending: Hematology | Admitting: Hematology

## 2022-01-06 DIAGNOSIS — H5213 Myopia, bilateral: Secondary | ICD-10-CM | POA: Diagnosis not present

## 2022-02-17 DIAGNOSIS — H40153 Residual stage of open-angle glaucoma, bilateral: Secondary | ICD-10-CM | POA: Diagnosis not present

## 2022-04-13 ENCOUNTER — Ambulatory Visit (INDEPENDENT_AMBULATORY_CARE_PROVIDER_SITE_OTHER): Payer: Medicare Other | Admitting: Family Medicine

## 2022-04-13 ENCOUNTER — Encounter: Payer: Self-pay | Admitting: Hematology

## 2022-04-13 ENCOUNTER — Encounter: Payer: Self-pay | Admitting: Family Medicine

## 2022-04-13 VITALS — BP 140/70 | HR 112 | Temp 98.3°F | Ht 68.5 in | Wt 279.6 lb

## 2022-04-13 DIAGNOSIS — L723 Sebaceous cyst: Secondary | ICD-10-CM | POA: Diagnosis not present

## 2022-04-13 DIAGNOSIS — R030 Elevated blood-pressure reading, without diagnosis of hypertension: Secondary | ICD-10-CM | POA: Diagnosis not present

## 2022-04-13 DIAGNOSIS — L0291 Cutaneous abscess, unspecified: Secondary | ICD-10-CM

## 2022-04-13 MED ORDER — DOXYCYCLINE HYCLATE 100 MG PO TABS: 100.0000 mg | ORAL_TABLET | Freq: Two times a day (BID) | ORAL | 0 refills | Status: AC

## 2022-04-13 NOTE — Progress Notes (Signed)
? ? ?  SUBJECTIVE:  ? ?CHIEF COMPLAINT / HPI:  ? ?Brandy Foster is a 46 yo who presents with a bump on the back of her neck  ? ?Had a cyst in that same area several years ago that drained on its own after using warm compress and fat back. Has had a small bump since then. Noticed it growing in size a few days ago. Mild pain. Also noticed another bump on her head about 2 days ago that is painful and hurts when she turns her head. Has not tried warm compresses on these areas. Denies any fevers, chills. Denies drainage  ? ?BP elevated today at 140/70 but she states it has not been in the past. Not currently on medication.  ? ? ?OBJECTIVE:  ? ?BP 140/70   Pulse (!) 112   Temp 98.3 ?F (36.8 ?C)   Ht 5' 8.5" (1.74 m)   Wt 279 lb 9.6 oz (126.8 kg)   LMP 03/30/2022 (Approximate)   SpO2 99%   BMI 41.89 kg/m?   ? ?Physical exam ?General: well appearing, NAD ?Cardiovascular: RRR, no murmurs ?Lungs: CTAB. Normal WOB ?Abdomen: soft, non-distended, non-tender ?Skin: warm, dry. Approx 2x2 inch fluctuant non draining cyst on posterior neck Also with similar sized nodule at base of occiput that may be another cyst forming ? ? ? ? ?ASSESSMENT/PLAN:  ? ?Sebaceous cyst ?Patient presents with 2 bumps on her neck and head that have progressed in size over the last several days. She notes the bump on her neck has been reoccurring, last drained a few years ago. Denies fevers or chills. On exam noted to be approx 2x2 inch fluctuant non draining cyst on posterior neck. Also with similar sized nodule at base of occiput deeper under skin that may be another cyst forming and is more tender to palpation than the cyst on her neck. Given the location and size of the cysts placed referral to gen surgery for removal. Recommended warm compresses in the meantime as well as prescribed Doxycycline for 5 days. Strict return precautions provided.  ?  ?Elevated blood pressure  ?BP 140/70 potentially in the context of increased pain from her cysts. She  states he has not had elevated BP in the past. I recommended checking some measurements at home in the morning and night for a few days and bringing them to her PCP at follow up appointment which she was agreeable to.  ? ?Advised patient to return for physical and blood pressure check  ? ?Shary Key, DO ?[place]   ?

## 2022-04-13 NOTE — Patient Instructions (Addendum)
It was great seeing you today! ? ?I recommend warm compresses to use daily to help drain it. I have also prescribed an antibiotic Doxycycline to use twice daily for 5 days.  I placed a referral for surgery to remove the cyst to prevent it from reoccurring, but if you find that by the time they call you to schedule that appointment it has already drained you can just cancel it. ? ?Also I recommend taking some blood pressure measurements at home in the morning and at night for a couple of days and bring those values in to your next visit. ? ?If you experience fevers, chills or new and concerning symptoms go to urgent care or the ED ? ?Please check-out at the front desk before leaving the clinic. Schedule an appointment for 2 weeks for follow up and your physical,  but if you need to be seen earlier than that for any new issues we're happy to fit you in, just give [place] a call! ? ?Visit Reminders: ?- Stop by the pharmacy to pick up your prescriptions  ?- Continue to work on your healthy eating habits and incorporating exercise into your daily life.  ? ?Feel free to call with any questions or concerns at any time, at [phone]. ?  ?Take care,  ?Dr. Shary Key ?[place]  ?

## 2022-04-15 DIAGNOSIS — L723 Sebaceous cyst: Secondary | ICD-10-CM | POA: Insufficient documentation

## 2022-04-15 NOTE — Assessment & Plan Note (Signed)
Patient presents with 2 bumps on her neck and head that have progressed in size over the last several days. She notes the bump on her neck has been reoccurring, last drained a few years ago. Denies fevers or chills. On exam noted to be approx 2x2 inch fluctuant non draining cyst on posterior neck. Also with similar sized nodule at base of occiput deeper under skin that may be another cyst forming and is more tender to palpation than the cyst on her neck. Given the location and size of the cysts placed referral to gen surgery for removal. Recommended warm compresses in the meantime as well as prescribed Doxycycline for 5 days. Strict return precautions provided.  ?

## 2022-04-22 ENCOUNTER — Ambulatory Visit: Payer: Medicare Other | Admitting: Family Medicine

## 2022-05-08 ENCOUNTER — Ambulatory Visit: Payer: Medicare Other | Admitting: Family Medicine

## 2022-05-08 NOTE — Progress Notes (Deleted)
    SUBJECTIVE:   Chief compliant/HPI: annual examination  Brandy Foster is a 46 y.o. who presents today for an annual exam.   Hypertension: BP is *** at goal today. She is not on any medication. She is ***asymptomatic today.   H/o iron deficiency anemia: Check CBC today. She is*** taking fe supplementation.  History tabs reviewed and updated.   Review of systems form reviewed and notable for ***.   OBJECTIVE:   There were no vitals taken for this visit.  ***  ASSESSMENT/PLAN:   No problem-specific Assessment & Plan notes found for this encounter.    Annual Examination  See AVS for age appropriate recommendations.   PHQ score ***, reviewed and discussed.  Blood pressure reviewed and at goal ***.  Asked about intimate partner violence and resources given as appropriate  The patient currently uses *** for contraception. Folate recommended as appropriate, minimum of 400 mcg per day.   Considered the following items based upon USPSTF recommendations: Diabetes screening: {discussed/ordered:14545} Screening for elevated cholesterol: {discussed/ordered:14545} HIV testing: {discussed/ordered:14545} Hepatitis C: {discussed/ordered:14545} Hepatitis B: {discussed/ordered:14545} Syphilis if at high risk: {discussed/ordered:14545} GC/CT {GC/CT screening :23818} Reviewed risk factors for latent tuberculosis and {not indicated/requested/declined:14582} Reviewed risk factors for osteoporosis. Using FRAX tool estimated risk of major osteoporotic fracture of  ***%, early screening {ordered not order:23822::"not ordered"}   Discussed family history, BRCA testing {not indicated/requested/declined:14582}. Tool used to risk stratify was ***.  Cervical cancer screening: prior Pap reviewed, repeat due in 2020. Will schedule for pap smear today. Breast cancer screening: {mammoscreen:23820} Colorectal cancer screening: {crcscreen:23821::"discussed, colonoscopy ordered"} if age 62 or over.    Follow up in 1 *** year or sooner if indicated.    Gladys Damme, MD [place]

## 2022-06-09 ENCOUNTER — Ambulatory Visit: Payer: Medicare Other | Admitting: Family Medicine

## 2022-06-09 NOTE — Progress Notes (Deleted)
    SUBJECTIVE:   CHIEF COMPLAINT / HPI:   ***  PERTINENT  PMH / PSH: ***  OBJECTIVE:   There were no vitals taken for this visit.  ***  ASSESSMENT/PLAN:   No problem-specific Assessment & Plan notes found for this encounter.     Gladys Damme, MD [place]

## 2022-06-10 NOTE — Progress Notes (Deleted)
    SUBJECTIVE:   CHIEF COMPLAINT / HPI:   Bump on neck:   Obesity: patient at risk for hyperlipidemia and DM2, will check a1c, CMP, and lipid panel today.   History of anemia: will check CBC today  Needs pap smear  Colonoscopy  PERTINENT  PMH / PSH: ***  OBJECTIVE:   There were no vitals taken for this visit.  ***  ASSESSMENT/PLAN:   No problem-specific Assessment & Plan notes found for this encounter.     Gladys Damme, MD [place]

## 2022-06-11 ENCOUNTER — Ambulatory Visit: Payer: Medicare Other | Admitting: Family Medicine

## 2022-06-11 DIAGNOSIS — Z131 Encounter for screening for diabetes mellitus: Secondary | ICD-10-CM

## 2022-06-11 DIAGNOSIS — D500 Iron deficiency anemia secondary to blood loss (chronic): Secondary | ICD-10-CM

## 2022-06-11 DIAGNOSIS — Z13220 Encounter for screening for lipoid disorders: Secondary | ICD-10-CM

## 2022-07-01 ENCOUNTER — Ambulatory Visit: Payer: Medicare Other | Admitting: Family Medicine

## 2022-07-20 ENCOUNTER — Encounter: Payer: Medicare Other | Admitting: Student

## 2022-07-27 ENCOUNTER — Ambulatory Visit (INDEPENDENT_AMBULATORY_CARE_PROVIDER_SITE_OTHER): Payer: Medicare Other | Admitting: Student

## 2022-07-27 VITALS — BP 124/80 | HR 100 | Wt 279.0 lb

## 2022-07-27 DIAGNOSIS — L723 Sebaceous cyst: Secondary | ICD-10-CM

## 2022-07-27 DIAGNOSIS — G479 Sleep disorder, unspecified: Secondary | ICD-10-CM

## 2022-07-27 MED ORDER — HYDROXYZINE HCL 10 MG PO TABS: 10.0000 mg | ORAL_TABLET | Freq: Every evening | ORAL | 0 refills | Status: DC | PRN

## 2022-07-27 MED ORDER — HYDROXYZINE HCL 10 MG PO TABS: 10.0000 mg | ORAL_TABLET | Freq: Three times a day (TID) | ORAL | 0 refills | Status: DC | PRN

## 2022-07-27 NOTE — Progress Notes (Signed)
  SUBJECTIVE:   CHIEF COMPLAINT / HPI:   Sebaceous cyst: Presented on 04/13/2022 with 2 bumps on her neck and head notable for sebaceous cysts.  She was referred to general surgery for removal and prescribed doxycycline for 5 days in addition to being advised to use warm compresses.  She does not recall ever being contacted by [facility] [place] [facility]. Would like to discuss further management.   Anxiety: states she has been stressed a lot. She is starting with a therapist on 8/17. She believes it is related to her mother passing recently and additionally having to care for her father. Difficulty sleeping, irritable. Lack of energy. She is eating and drinking normally. No SI/HI.  Denies chest pain, shortness of breath.  PERTINENT  PMH / PSH: sebaceous cyst  OBJECTIVE:  BP 124/80   Pulse 100   Wt 279 lb (126.6 kg)   SpO2 100%   BMI 41.80 kg/m   General: NAD, pleasant, able to participate in exam Skin: 2 cm x 2 cm minimally raised sebaceous cyst without drainage or signs of infection on posterior neck Psych: Normal affect and mood  ASSESSMENT/PLAN:  Sebaceous cyst Not inflamed or actively infected currently.  Recommend removal for best chance of avoiding recurrence. Provided information for [facility] [place] [facility] which she was referred previously.    Difficulty sleeping Irritability and difficulty sleeping some nights.  Denies SI/HI.  States much of this began when her mother passed away earlier this year and she is also having to care for her father.  Established with therapist and applauded for this.  Hydroxyzine 10 mg at bedtime as needed.  Follow-up in 3 weeks during annual physical.  Return in about 24 days (around 08/20/2022) for Physical exam and anxiety follow-up. Wells Guiles, DO 07/28/2022, 7:52 AM PGY-2, [place]

## 2022-07-27 NOTE — Assessment & Plan Note (Signed)
Irritability and difficulty sleeping some nights.  Denies SI/HI.  States much of this began when her mother passed away earlier this year and she is also having to care for her father.  Established with therapist and applauded for this.  Hydroxyzine 10 mg at bedtime as needed.  Follow-up in 3 weeks during annual physical.

## 2022-07-27 NOTE — Patient Instructions (Signed)
It was great to see you today! Thank you for choosing [facility] for your primary care. Brandy Foster was seen for sleeplessness related to anxiety and sebaceous cyst on neck.  Today we addressed: Sleeplessness/anxiety: Congratulations on getting set up with therapy.  This will be the most beneficial for you of anything that we can do.  I am prescribing you hydroxyzine to take as needed at nighttime which will help with both the anxiety and sleeplessness. Sebaceous cyst on neck: Below, you will see the contact information for [facility].  Please reach out to them to schedule an appointment to discuss removing the sebaceous cyst.  The referral is already in so there should be nothing further for [place] to do on our part but you will need to contact them. You are scheduled for a physical with your primary care doctor.  At that time, he will be able to get caught up on any lab testing, Pap, mammogram, colonoscopy needs that you have.  If you haven't already, sign up for My Chart to have easy access to your labs results, and communication with your primary care physician.  We are checking some labs today. If they are abnormal, I will call you. If they are normal, I will send you a MyChart message (if it is active) or a letter in the mail. If you do not hear about your labs in the next 2 weeks, please call the office.   You should return to our clinic Return in about 24 days (around 08/20/2022).  I recommend that you always bring your medications to each appointment as this makes it easy to ensure you are on the correct medications and helps [place] not miss refills when you need them.  Please arrive 15 minutes before your appointment to ensure smooth check in process.  We appreciate your efforts in making this happen.  Please call the clinic at [phone] if your symptoms worsen or you have any concerns.  Thank you for allowing me to participate in your care, Wells Guiles,  DO 07/27/2022, 2:07 PM PGY-2, [place]   Please call [facility] about your referral to them.  Here is their contact information:  Address: [address], [place], [place] [address] Phone: [phone]

## 2022-07-27 NOTE — Assessment & Plan Note (Signed)
Not inflamed or actively infected currently.  Recommend removal for best chance of avoiding recurrence. Provided information for [facility] [place] [facility] which she was referred previously.

## 2022-08-06 DIAGNOSIS — Z79899 Other long term (current) drug therapy: Secondary | ICD-10-CM | POA: Diagnosis not present

## 2022-08-18 NOTE — Patient Instructions (Signed)
It was great to see you! Thank you for allowing me to participate in your care!   I recommend that you always bring your medications to each appointment as this makes it easy to ensure we are on the correct medications and helps [place] not miss when refills are needed.  Our plans for today:  - Checking your lipids, electrolytes and blood today - Please take iron tablet '325mg'$  twice a day - Stay hydrated and cut down on caffeine - [place] go to your oncology appointment - Follow-up with Dr. Nelda Bucks in 4 weeks  Today at your annual preventive visit we talked about the following measures:   I recommend 150 minutes of exercise per week-try 30 minutes 5 days per week We discussed reducing sugary beverages (like soda and juice)  We are checking some labs today, I will call you if they are abnormal will send you a MyChart message or a letter if they are normal.  If you do not hear about your labs in the next 2 weeks please let us know.   Take care and seek immediate care sooner if you develop any concerns. Please remember to show up 15 minutes before your scheduled appointment time!  Leslie Dales, DO [facility]

## 2022-08-18 NOTE — Progress Notes (Unsigned)
SUBJECTIVE:   Chief compliant/HPI: annual examination  Brandy Foster is a 46 y.o. who presents today for an annual exam.   History tabs reviewed and updated ***.   Review of systems form reviewed and notable for ***.   OBJECTIVE:   There were no vitals taken for this visit.  ***  ASSESSMENT/PLAN:   No problem-specific Assessment & Plan notes found for this encounter.    Annual Examination  See AVS for age appropriate recommendations.   PHQ score ***, reviewed and discussed.  Blood pressure reviewed and at goal ***.  Asked about intimate partner violence and resources given as appropriate  The patient currently uses *** for contraception. Folate recommended as appropriate, minimum of 400 mcg per day.   Considered the following items based upon USPSTF recommendations: Diabetes screening: {discussed/ordered:14545} Screening for elevated cholesterol: {discussed/ordered:14545} HIV testing: {discussed/ordered:14545} Hepatitis C: {discussed/ordered:14545} Hepatitis B: {discussed/ordered:14545} Syphilis if at high risk: {discussed/ordered:14545} GC/CT {GC/CT screening :23818} Reviewed risk factors for latent tuberculosis and {not indicated/requested/declined:14582} Reviewed risk factors for osteoporosis. Using FRAX tool estimated risk of major osteoporotic fracture of  ***%, early screening {ordered not order:23822::"not ordered"}   Discussed family history, BRCA testing {not indicated/requested/declined:14582}. Tool used to risk stratify was ***.  Cervical cancer screening: {PAPTYPE:23819} Breast cancer screening: {mammoscreen:23820} Colorectal cancer screening: {crcscreen:23821::"discussed, colonoscopy ordered"} if age 45 or over.   Follow up in 1 *** year or sooner if indicated.    Martin Bronson, MD [facility]   

## 2022-08-20 ENCOUNTER — Encounter: Payer: Self-pay | Admitting: Student

## 2022-08-20 ENCOUNTER — Other Ambulatory Visit: Payer: Self-pay | Admitting: Family Medicine

## 2022-08-20 ENCOUNTER — Other Ambulatory Visit: Payer: Self-pay

## 2022-08-20 ENCOUNTER — Ambulatory Visit (INDEPENDENT_AMBULATORY_CARE_PROVIDER_SITE_OTHER): Payer: Medicare Other | Admitting: Student

## 2022-08-20 VITALS — BP 94/45 | HR 88 | Wt 277.4 lb

## 2022-08-20 DIAGNOSIS — D500 Iron deficiency anemia secondary to blood loss (chronic): Secondary | ICD-10-CM

## 2022-08-20 DIAGNOSIS — Z1211 Encounter for screening for malignant neoplasm of colon: Secondary | ICD-10-CM | POA: Diagnosis not present

## 2022-08-20 DIAGNOSIS — R42 Dizziness and giddiness: Secondary | ICD-10-CM | POA: Diagnosis not present

## 2022-08-20 NOTE — Assessment & Plan Note (Addendum)
Previously resolved problem, restarted 1 year ago and has been consistent. Orthostatics negative today.  Dix-Hallpike negative, less likely vertigo.  Not related to position or exertion.  No associated symptoms.  Leading differential is worsening iron deficiency anemia- she has not followed up with heme-onc in over a year and required iron transfusions in the past.  Her hemoglobin has been as low as 7 three years ago. - Anemia profile B labs - '325mg'$  ferrous sulfate twice daily - Follow-up with heme-onc next month - Follow-up with [place] in 2 months - Consider repeat orthostatics, cardiac testing at next visit if still symptomatic and no explanation.

## 2022-08-20 NOTE — Assessment & Plan Note (Signed)
BMI 41.57. - Check BMP, lipid panel

## 2022-08-21 ENCOUNTER — Telehealth: Payer: Self-pay | Admitting: Student

## 2022-08-21 ENCOUNTER — Other Ambulatory Visit: Payer: Self-pay | Admitting: Student

## 2022-08-21 DIAGNOSIS — Z862 Personal history of diseases of the blood and blood-forming organs and certain disorders involving the immune mechanism: Secondary | ICD-10-CM

## 2022-08-21 DIAGNOSIS — H40023 Open angle with borderline findings, high risk, bilateral: Secondary | ICD-10-CM | POA: Diagnosis not present

## 2022-08-21 LAB — ANEMIA PROFILE B
Basophils Absolute: 0.1 10*3/uL (ref 0.0–0.2)
Basos: 1 %
EOS (ABSOLUTE): 0.1 10*3/uL (ref 0.0–0.4)
Eos: 1 %
Ferritin: 3 ng/mL — ABNORMAL LOW (ref 15–150)
Folate: 10 ng/mL (ref >3.0)
Hematocrit: 29.8 % — ABNORMAL LOW (ref 34.0–46.6)
Hemoglobin: 8 g/dL — ABNORMAL LOW (ref 11.1–15.9)
Immature Grans (Abs): 0 10*3/uL (ref 0.0–0.1)
Immature Granulocytes: 0 %
Iron Saturation: 4 % — CL (ref 15–55)
Iron: 16 ug/dL — ABNORMAL LOW (ref 27–159)
Lymphocytes Absolute: 2.7 10*3/uL (ref 0.7–3.1)
Lymphs: 35 %
MCH: 19.2 pg — ABNORMAL LOW (ref 26.6–33.0)
MCHC: 26.8 g/dL — ABNORMAL LOW (ref 31.5–35.7)
MCV: 72 fL — ABNORMAL LOW (ref 79–97)
Monocytes Absolute: 0.6 10*3/uL (ref 0.1–0.9)
Monocytes: 8 %
Neutrophils Absolute: 4.3 10*3/uL (ref 1.4–7.0)
Neutrophils: 55 %
Platelets: 385 10*3/uL (ref 150–450)
RBC: 4.16 x10E6/uL (ref 3.77–5.28)
RDW: 17.8 % — ABNORMAL HIGH (ref 11.7–15.4)
Retic Ct Pct: 1.6 % (ref 0.6–2.6)
Total Iron Binding Capacity: 445 ug/dL (ref 250–450)
UIBC: 429 ug/dL — ABNORMAL HIGH (ref 131–425)
Vitamin B-12: 359 pg/mL (ref 232–1245)
WBC: 7.8 10*3/uL (ref 3.4–10.8)

## 2022-08-21 LAB — BASIC METABOLIC PANEL
BUN/Creatinine Ratio: 11 (ref 9–23)
BUN: 8 mg/dL (ref 6–24)
CO2: 20 mmol/L (ref 20–29)
Calcium: 9.1 mg/dL (ref 8.7–10.2)
Chloride: 106 mmol/L (ref 96–106)
Creatinine, Ser: 0.71 mg/dL (ref 0.57–1.00)
Glucose: 95 mg/dL (ref 70–99)
Potassium: 4 mmol/L (ref 3.5–5.2)
Sodium: 141 mmol/L (ref 134–144)
eGFR: 106 mL/min/{1.73_m2} (ref >59)

## 2022-08-21 LAB — LIPID PANEL
Chol/HDL Ratio: 4.3 ratio (ref 0.0–4.4)
Cholesterol, Total: 185 mg/dL (ref 100–199)
HDL: 43 mg/dL (ref >39)
LDL Chol Calc (NIH): 111 mg/dL — ABNORMAL HIGH (ref 0–99)
Triglycerides: 176 mg/dL — ABNORMAL HIGH (ref 0–149)
VLDL Cholesterol Cal: 31 mg/dL (ref 5–40)

## 2022-08-21 MED ORDER — FERROUS SULFATE 325 (65 FE) MG PO TABS: 325.0000 mg | ORAL_TABLET | Freq: Two times a day (BID) | ORAL | 2 refills | Status: DC

## 2022-08-21 NOTE — Telephone Encounter (Signed)
I spoke with patient about her low Hgb of 8. We discussed taking her home iron pills. She declines iron transfusion at this time, and wants to see her heme/onc this month before deciding. I counseled her to go to ED if she experienced concerning symptoms including but not limited to: Chest pain, SOB, LOC. Additionally we talked about high LDL and she can follow this up at her next appointment with [place].

## 2022-08-21 NOTE — Telephone Encounter (Signed)
-----   Message from Kinnie Feil, MD sent at 08/21/2022 10:40 AM EDT -----  ----- Message ----- From: Lavone Neri Lab Results In Sent: 08/21/2022  10:39 AM EDT To: Kinnie Feil, MD

## 2022-08-21 NOTE — Telephone Encounter (Signed)
Patient LVM on nurse line returning phone call to provider. Please return call to [phone].  Talbot Grumbling, RN

## 2022-08-21 NOTE — Telephone Encounter (Signed)
Called patient two times, but she did not answer. No mychart. I will send certified letter of her abnormal results.

## 2022-08-28 ENCOUNTER — Telehealth: Payer: Self-pay

## 2022-08-28 ENCOUNTER — Other Ambulatory Visit: Payer: Self-pay | Admitting: Student

## 2022-08-28 DIAGNOSIS — G479 Sleep disorder, unspecified: Secondary | ICD-10-CM

## 2022-08-28 NOTE — Telephone Encounter (Signed)
Patient calls nurse line in regards to results.   Patient reports she received the letter about cholesterol.   Patient would like to move forward with starting a cholesterol medication.   Will forward to PCP.

## 2022-08-31 NOTE — Telephone Encounter (Signed)
Scheduled pt for f/u visit 09/20 '@11'$  a.m

## 2022-09-01 ENCOUNTER — Other Ambulatory Visit: Payer: Self-pay

## 2022-09-01 DIAGNOSIS — D500 Iron deficiency anemia secondary to blood loss (chronic): Secondary | ICD-10-CM

## 2022-09-02 ENCOUNTER — Other Ambulatory Visit: Payer: Self-pay

## 2022-09-02 ENCOUNTER — Inpatient Hospital Stay (HOSPITAL_BASED_OUTPATIENT_CLINIC_OR_DEPARTMENT_OTHER): Payer: Medicare Other | Admitting: Hematology

## 2022-09-02 ENCOUNTER — Inpatient Hospital Stay: Payer: Medicare Other | Attending: Hematology

## 2022-09-02 ENCOUNTER — Other Ambulatory Visit (HOSPITAL_COMMUNITY): Payer: Self-pay

## 2022-09-02 ENCOUNTER — Encounter: Payer: Self-pay | Admitting: Hematology

## 2022-09-02 VITALS — BP 137/76 | HR 91 | Temp 97.3°F | Resp 15 | Wt 278.9 lb

## 2022-09-02 DIAGNOSIS — N920 Excessive and frequent menstruation with regular cycle: Secondary | ICD-10-CM | POA: Insufficient documentation

## 2022-09-02 DIAGNOSIS — D500 Iron deficiency anemia secondary to blood loss (chronic): Secondary | ICD-10-CM | POA: Diagnosis not present

## 2022-09-02 LAB — CBC WITH DIFFERENTIAL (CANCER CENTER ONLY)
Abs Immature Granulocytes: 0.02 10*3/uL (ref 0.00–0.07)
Basophils Absolute: 0.1 10*3/uL (ref 0.0–0.1)
Basophils Relative: 1 %
Eosinophils Absolute: 0.2 10*3/uL (ref 0.0–0.5)
Eosinophils Relative: 3 %
HCT: 32.9 % — ABNORMAL LOW (ref 36.0–46.0)
Hemoglobin: 9 g/dL — ABNORMAL LOW (ref 12.0–15.0)
Immature Granulocytes: 0 %
Lymphocytes Relative: 27 %
Lymphs Abs: 2.4 10*3/uL (ref 0.7–4.0)
MCH: 21 pg — ABNORMAL LOW (ref 26.0–34.0)
MCHC: 27.4 g/dL — ABNORMAL LOW (ref 30.0–36.0)
MCV: 76.7 fL — ABNORMAL LOW (ref 80.0–100.0)
Monocytes Absolute: 0.7 10*3/uL (ref 0.1–1.0)
Monocytes Relative: 8 %
Neutro Abs: 5.4 10*3/uL (ref 1.7–7.7)
Neutrophils Relative %: 61 %
Platelet Count: 350 10*3/uL (ref 150–400)
RBC: 4.29 MIL/uL (ref 3.87–5.11)
RDW: 24.4 % — ABNORMAL HIGH (ref 11.5–15.5)
WBC Count: 8.9 10*3/uL (ref 4.0–10.5)
nRBC: 0 % (ref 0.0–0.2)

## 2022-09-02 LAB — CMP (CANCER CENTER ONLY)
ALT: 9 U/L (ref 0–44)
AST: 18 U/L (ref 15–41)
Albumin: 4.1 g/dL (ref 3.5–5.0)
Alkaline Phosphatase: 59 U/L (ref 38–126)
Anion gap: 3 — ABNORMAL LOW (ref 5–15)
BUN: 10 mg/dL (ref 6–20)
CO2: 28 mmol/L (ref 22–32)
Calcium: 9.7 mg/dL (ref 8.9–10.3)
Chloride: 106 mmol/L (ref 98–111)
Creatinine: 0.75 mg/dL (ref 0.44–1.00)
GFR, Estimated: >60 mL/min (ref >60)
Glucose, Bld: 93 mg/dL (ref 70–99)
Potassium: 4 mmol/L (ref 3.5–5.1)
Sodium: 137 mmol/L (ref 135–145)
Total Bilirubin: 0.3 mg/dL (ref 0.3–1.2)
Total Protein: 7 g/dL (ref 6.5–8.1)

## 2022-09-02 LAB — IRON AND IRON BINDING CAPACITY (CC-WL,HP ONLY)
Iron: 57 ug/dL (ref 28–170)
Saturation Ratios: 12 % (ref 10.4–31.8)
TIBC: 468 ug/dL — ABNORMAL HIGH (ref 250–450)
UIBC: 411 ug/dL (ref 148–442)

## 2022-09-02 LAB — FERRITIN: Ferritin: 8 ng/mL — ABNORMAL LOW (ref 11–307)

## 2022-09-02 MED ORDER — POLYSACCHARIDE IRON COMPLEX 150 MG PO CAPS
150.0000 mg | ORAL_CAPSULE | Freq: Every day | ORAL | 3 refills | Status: DC
  Filled 2022-09-02 – 2022-12-03 (×3): qty 60, 60d supply, fill #0

## 2022-09-03 ENCOUNTER — Other Ambulatory Visit (HOSPITAL_COMMUNITY): Payer: Self-pay

## 2022-09-08 ENCOUNTER — Inpatient Hospital Stay: Payer: Medicare Other

## 2022-09-08 ENCOUNTER — Encounter: Payer: Self-pay | Admitting: Hematology

## 2022-09-08 ENCOUNTER — Telehealth: Payer: Self-pay | Admitting: Hematology

## 2022-09-08 NOTE — Telephone Encounter (Signed)
Per 9/19 phone line pt called to cancel infusion  pt said she was not feeling well and to cancel today and she would keep next week as scheduled

## 2022-09-08 NOTE — Progress Notes (Signed)
HEMATOLOGY/ONCOLOGY CLINIC NOTE  Date of Service: 09/02/2022  Patient Care Team: Leslie Dales, MD as PCP - General (Family Medicine)   CHIEF COMPLAINTS/PURPOSE OF CONSULTATION:  Iron deficiency anemia   HISTORY OF PRESENTING ILLNESS:  Brandy Foster is a wonderful 46 y.o. female who has been referred to [place] by Dr. Harriet Butte for evaluation and management of Iron deficiency anemia. The pt reports that she is doing well overall.   The pt notes that she currently feels tired, and denies any overt light headedness or dizziness.   However, the pt reports that she has had sensations that she is about to pass out for a few months, and that this was worse in the summertime. She notes that she hasn't felt as though she was going to pass out recently. She notes she has intermittently taken PO Iron replacement for several years. She is taking one pill of '325mg'$  Ferrous Sulfate a day, but forgets some days, and takes it with food, but notes that she has some abdominal discomfort with PO Iron replacement. The pt notes that she has ice cravings.  The pt notes that she has heavy periods, which has only been the case for the past few months. She notes that she uses two pads simultaneously for the first couple days and her periods last about 5 days. The pt notes that she last saw her OBGYN about a year ago. She notes that she had small fibroids 10 years ago, which she discovered during her second pregnancy. She has had three pregnancies, two successful pregnancies 10 and 21 years ago, and one miscarriage a "few years ago." She denies any recent surgeries, and denies any blood in the stools or other blood loss.   The pt denies any dietary restrictions. She denies taking any acid suppressant medication.   The pt notes that she has glaucoma and uses nightly eye drops. She denies any other medical problems.   Most recent lab results (09/07/18) of CBC w/diff is as follows: all values are WNL except for  RBC at 3.63, HGB at 7.7, HCT at 26.4, MCV at 73, MCH at 21.2, MCHC at 29.2, RDW at 16.1. 09/15/18 Anemia Panel revealed all values WNL except for Iron at 20, Iron Saturation at 5%, HCT at 276, Ferritin at 3.   On review of systems, pt reports feeling tired, heavy periods, and denies blood in the stools, current light headedness or dizziness, black stools, abdominal pains, leg swelling, and any other symptoms.   On PMHx the pt reports two cesarean sections 21 and 10 years ago, Glaucoma. Denies liver problems.  On Family Hx the pt reports iron deficiency anemia and denies sickle cell disease, blood disorders   INTERVAL HISTORY: Brandy Foster is here today for follow up and treatment of her Iron Deficiency Anemia. The patient's last visit with [place] was on 08/03/2019. She reports She is doing well with no new symptoms or concerns.  We discussed in light of her worsening iron deficiency anemia that we could get IV injectafer weekly x 2 doses which she is agreeable to at this time.  Labs done today were reviewed in detail.  MEDICAL HISTORY:  Past Medical History:  Diagnosis Date   Anemia    Anxiety    Chronic mental illness 06/19/2011   Pt states that she is on disability 2/2 mental illness but she doesn't know what her diagnosis is.  Pt is to request records from her psychiatrist.     Chronic  mental illness 06/19/2011   Pt states that she is on disability 2/2 mental illness but she doesn't know what her diagnosis is.  Pt is to request records from her psychiatrist.     Depression    Obesity     SURGICAL HISTORY: Past Surgical History:  Procedure Laterality Date   CESAREAN SECTION      SOCIAL HISTORY: Social History   Socioeconomic History   Marital status: Single    Spouse name: Not on file   Number of children: Not on file   Years of education: Not on file   Highest education level: Not on file  Occupational History   Not on file  Tobacco Use   Smoking status: Never    Smokeless tobacco: Never  Substance and Sexual Activity   Alcohol use: No   Drug use: No   Sexual activity: Yes    Birth control/protection: None  Other Topics Concern   Not on file  Social History Narrative   On disability- not sure what her disability is- for mental  Health reasons.  Unsure of diagnosis.     Mother gets disability check and gives pt the money. - mom pays bills and then gives her the rest.    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    Lives at rental home- has 2 children (ages 78 Manufacturing engineer) and 46 (jabaree))            Social Determinants of Radio broadcast assistant Strain: Not on file  Food Insecurity: Not on file  Transportation Needs: Not on file  Physical Activity: Not on file  Stress: Not on file  Social Connections: Not on file  Intimate Partner Violence: Not on file    FAMILY HISTORY: Family History  Problem Relation Age of Onset   Diabetes Mother    COPD Mother    Arthritis Mother    Hypertension Mother    Hyperlipidemia Father    Schizophrenia Brother     ALLERGIES:  has No Known Allergies.  MEDICATIONS:  Current Outpatient Medications  Medication Sig Dispense Refill   cetirizine (ZYRTEC) 10 MG tablet Take 1 tablet (10 mg total) by mouth daily as needed for allergies. 30 tablet 11   fluconazole (DIFLUCAN) 150 MG tablet Take 1 tablet now, then 1 tablet in 72 hours 2 tablet 0   fluticasone (FLONASE) 50 MCG/ACT nasal spray Place 2 sprays into both nostrils daily. 16 g 6   hydrOXYzine (ATARAX) 10 MG tablet TAKE 1 TABLET (10 MG TOTAL) BY MOUTH AT BEDTIME AS NEEDED FOR ANXIETY 90 tablet 1   iron polysaccharides (NIFEREX) 150 MG capsule Take 1 capsule (150 mg total) by mouth daily. 60 capsule 3   LATANOPROST OP Place 1 drop into both eyes at bedtime.     naproxen (NAPROSYN) 500 MG tablet Take 1 tablet (500 mg total) by mouth 2 (two) times daily. 20 tablet 0    No current facility-administered medications for this visit.    REVIEW OF SYSTEMS:   A 10+ POINT REVIEW OF SYSTEMS WAS OBTAINED including neurology, dermatology, psychiatry, cardiac, respiratory, lymph, extremities, GI, GU, Musculoskeletal, constitutional, breasts, reproductive, HEENT.  All pertinent positives are noted in the HPI.  All others are negative.     PHYSICAL EXAMINATION:  . Vitals:   09/02/22 1247  BP: 137/76  Pulse: 91  Resp: 15  Temp: (!) 97.3 F (36.3 C)  SpO2: 95%  Filed Weights   09/02/22 1247  Weight: 278 lb 14.4 oz (126.5 kg)   Body mass index is 41.79 kg/m. NAD GENERAL:alert, in no acute distress and comfortable SKIN: no acute rashes, no significant lesions EYES: conjunctiva are pink and non-injected, sclera anicteric NECK: supple, no JVD LYMPH:  no palpable lymphadenopathy in the cervical, axillary or inguinal regions LUNGS: clear to auscultation b/l with normal respiratory effort HEART: regular rate & rhythm ABDOMEN:  normoactive bowel sounds , non tender, not distended. Extremity: no pedal edema PSYCH: alert & oriented x 3 with fluent speech NEURO: no focal motor/sensory deficits   LABORATORY DATA:  I have reviewed the data as listed  .    Latest Ref Rng & Units 09/02/2022   12:37 PM 08/20/2022    2:54 PM 08/03/2019   11:40 AM  CBC  WBC 4.0 - 10.5 K/uL 8.9  7.8  8.7   Hemoglobin 12.0 - 15.0 g/dL 9.0  8.0  10.5   Hematocrit 36.0 - 46.0 % 32.9  29.8  35.6   Platelets 150 - 400 K/uL 350  385  293     .    Latest Ref Rng & Units 09/02/2022   12:37 PM 08/20/2022    2:54 PM 08/03/2019   11:40 AM  CMP  Glucose 70 - 99 mg/dL 93  95  86   BUN 6 - 20 mg/dL '10  8  10   '$ Creatinine 0.44 - 1.00 mg/dL 0.75  0.71  0.79   Sodium 135 - 145 mmol/L 137  141  137   Potassium 3.5 - 5.1 mmol/L 4.0  4.0  4.1   Chloride 98 - 111 mmol/L 106  106  105   CO2 22 - 32 mmol/L '28  20  23   '$ Calcium 8.9 - 10.3 mg/dL 9.7  9.1  9.1   Total Protein 6.5 - 8.1  g/dL 7.0   6.9   Total Bilirubin 0.3 - 1.2 mg/dL 0.3   0.2   Alkaline Phos 38 - 126 U/L 59   55   AST 15 - 41 U/L 18   15   ALT 0 - 44 U/L 9   7    . Lab Results  Component Value Date   IRON 57 09/02/2022   TIBC 468 (H) 09/02/2022   IRONPCTSAT 12 09/02/2022   (Iron and TIBC)  Lab Results  Component Value Date   FERRITIN 8 (L) 09/02/2022      RADIOGRAPHIC STUDIES: I have personally reviewed the radiological images as listed and agreed with the findings in the report. No results found.  ASSESSMENT & PLAN:   46 y.o. female with  1. Severe Iron deficiency anemia - related to menorrhagia  PLAN -Discussed patient's most recent labs hgb down to 9 -today Anemia Panel revealed 12% Iron Saturation, and a Ferritin of 8 -Patient is significantly iron deficient, likely related to her menorrhagia -Advised that the pt follow up with OBGYN for further evaluation of her menorrhagia and history of fibroids -Will order IV Injectafer x weekly for 2 doses if patient agreeable -Recommend that the pt begin taking a Vitamin B complex OTC  FOLLOW UP: IV injectafer weekly x 2 doses (has had Injectafer previously) RTC with Dr Irene Limbo with labs in 4 months   All of the patients questions were answered with apparent satisfaction. The patient knows to call the clinic with any problems, questions or concerns.  The total time spent in the appt was 20 minutes and more  than 50% was on counseling and direct patient cares.     Sullivan Lone MD MS AAHIVMS [facility] Hematology/Oncology Physician [facility]  (Office):       [phone] (Work cell):  [phone] (Fax):           [phone]  I, Melene Muller, am acting as scribe for Dr. Sullivan Lone, MD.  .I have reviewed the above documentation for accuracy and completeness, and I agree with the above. Brunetta Genera MD

## 2022-09-09 ENCOUNTER — Ambulatory Visit (INDEPENDENT_AMBULATORY_CARE_PROVIDER_SITE_OTHER): Payer: Medicare Other | Admitting: Student

## 2022-09-09 ENCOUNTER — Encounter: Payer: Self-pay | Admitting: Student

## 2022-09-09 VITALS — BP 114/76 | HR 83 | Ht 68.0 in | Wt 281.6 lb

## 2022-09-09 DIAGNOSIS — D500 Iron deficiency anemia secondary to blood loss (chronic): Secondary | ICD-10-CM

## 2022-09-09 DIAGNOSIS — R7301 Impaired fasting glucose: Secondary | ICD-10-CM | POA: Diagnosis not present

## 2022-09-09 NOTE — Patient Instructions (Signed)
It was great to see you! Thank you for allowing me to participate in your care!   I recommend that you always bring your medications to each appointment as this makes it easy to ensure we are on the correct medications and helps [place] not miss when refills are needed.  Our plans for today:  - Avoid caloric drinks, substitute for drinks without sugar. - Try to get 30 minutes of walking outside of work at least 4 days a week - Please get your iron infusion next week - Follow-up in 2 months to discuss weight loss goals  We are checking some labs today, I will call you if they are abnormal will send you a MyChart message or a letter if they are normal.  If you do not hear about your labs in the next 2 weeks please let us know.  Take care and seek immediate care sooner if you develop any concerns. Please remember to show up 15 minutes before your scheduled appointment time!  Leslie Dales, DO [facility]

## 2022-09-09 NOTE — Assessment & Plan Note (Signed)
She has had elevated fasting glucoses in the past.  Has not had A1c checked in over 3 years. - Check A1c

## 2022-09-09 NOTE — Assessment & Plan Note (Addendum)
She is asymptomatic today.  She missed her infusion yesterday due to headache.  Counseled patient on importance of this infusion. She will keep her appointment for infusion next week.  Additionally insurance denied Niferex coverage. - Continue to take home iron - Iron infusion next week

## 2022-09-09 NOTE — Assessment & Plan Note (Signed)
BMI 42.82 today.  She is interested in losing weight.  It is her preference that we try lifestyle modification before medication.  Her goal is to lose weight to prevent need for medication and lower cholesterol.  We had extensive conversation about current habits, and prior attempts at weight loss.  We discussed that this will take many appointments, and we will make small steps towards goal each visit.  Current goal is to reduce caloric drink intake, and increase exercise to 30-minute walk outside of work 4 days a week. - Follow-up in 2 months

## 2022-09-09 NOTE — Progress Notes (Signed)
    SUBJECTIVE:   CHIEF COMPLAINT / HPI:  Follow-up for elevated LDL and weight loss We discussed that patient's ASCVD score was 1%, however she has not been tested for diabetes within the last 3 years and she has had elevated fasting glucose.  We also discussed the need for medication is not required at this time-and she agrees with this.  She reports that she has had thoughts about losing weight in the past, and is willing to try weight loss to prevent need from progressing to medication management.  She reports that she is not currently watching her diet, drinks many caloric drinks per day, walks only at work.  No physical activity outside of work-but is complicated by her fatigue from severe iron deficiency anemia. Iron deficiency She saw her heme oncologist and they recommended iron infusion and Niferex for severe iron deficiency anemia.  She missed her infusion yesterday due to headache, however will have another infusion next week.  Her insurance does not cover Niferex, and she is continuing to take her OTC iron at this time.  PERTINENT  PMH / PSH: Seasonal allergic rhinitis, iron deficiency anemia  OBJECTIVE:   BP 114/76   Pulse 83   Ht '5\' 8"'$  (1.727 m)   Wt 281 lb 9.6 oz (127.7 kg)   LMP  (LMP Unknown)   SpO2 100%   BMI 42.82 kg/m    General: Nonacute distress Cardio: Regular rate and rhythm, no MRG Respiratory: CTAB, clear to auscultation bilaterally  ASSESSMENT/PLAN:   Elevated fasting glucose She has had elevated fasting glucoses in the past.  Has not had A1c checked in over 3 years. - Check A1c  Severe obesity (BMI >= 40) (HCC) BMI 42.82 today.  She is interested in losing weight.  It is her preference that we try lifestyle modification before medication.  Her goal is to lose weight to prevent need for medication and lower cholesterol.  We had extensive conversation about current habits, and prior attempts at weight loss.  We discussed that this will take many  appointments, and we will make small steps towards goal each visit.  Current goal is to reduce caloric drink intake, and increase exercise to 30-minute walk outside of work 4 days a week. - Follow-up in 2 months  Iron deficiency anemia due to chronic blood loss She is asymptomatic today.  She missed her infusion yesterday due to headache.  Counseled patient on importance of this infusion. She will keep her appointment for infusion next week.  Additionally insurance denied Niferex coverage. - Continue to take home iron - Iron infusion next week    Leslie Dales, [place]

## 2022-09-16 ENCOUNTER — Inpatient Hospital Stay: Payer: Medicare Other

## 2022-09-19 ENCOUNTER — Other Ambulatory Visit (HOSPITAL_COMMUNITY): Payer: Self-pay

## 2022-10-12 DIAGNOSIS — R110 Nausea: Secondary | ICD-10-CM | POA: Diagnosis not present

## 2022-10-12 DIAGNOSIS — N390 Urinary tract infection, site not specified: Secondary | ICD-10-CM | POA: Diagnosis not present

## 2022-11-03 ENCOUNTER — Other Ambulatory Visit (HOSPITAL_COMMUNITY): Payer: Self-pay

## 2022-11-04 DIAGNOSIS — M25561 Pain in right knee: Secondary | ICD-10-CM | POA: Diagnosis not present

## 2022-11-19 ENCOUNTER — Telehealth: Payer: Self-pay | Admitting: Hematology

## 2022-11-19 NOTE — Telephone Encounter (Signed)
Attempted to call patient to notify of changed appointment times. Voicemail was full. Mailing calendar.

## 2022-11-25 ENCOUNTER — Inpatient Hospital Stay: Payer: Medicare Other | Attending: Hematology

## 2022-11-25 DIAGNOSIS — D500 Iron deficiency anemia secondary to blood loss (chronic): Secondary | ICD-10-CM | POA: Insufficient documentation

## 2022-11-25 DIAGNOSIS — N920 Excessive and frequent menstruation with regular cycle: Secondary | ICD-10-CM | POA: Insufficient documentation

## 2022-12-03 ENCOUNTER — Other Ambulatory Visit: Payer: Self-pay

## 2022-12-03 ENCOUNTER — Encounter: Payer: Self-pay | Admitting: Hematology

## 2022-12-03 ENCOUNTER — Inpatient Hospital Stay: Payer: Medicare Other

## 2022-12-03 ENCOUNTER — Other Ambulatory Visit (HOSPITAL_COMMUNITY): Payer: Self-pay

## 2022-12-05 ENCOUNTER — Inpatient Hospital Stay: Payer: Medicare Other

## 2022-12-05 VITALS — BP 126/73 | HR 72 | Temp 97.8°F | Resp 16

## 2022-12-05 DIAGNOSIS — D500 Iron deficiency anemia secondary to blood loss (chronic): Secondary | ICD-10-CM | POA: Diagnosis not present

## 2022-12-05 DIAGNOSIS — N920 Excessive and frequent menstruation with regular cycle: Secondary | ICD-10-CM | POA: Diagnosis present

## 2022-12-05 MED ORDER — ACETAMINOPHEN 325 MG PO TABS
650.0000 mg | ORAL_TABLET | Freq: Once | ORAL | Status: AC
  Administered 2022-12-05: 650 mg via ORAL
  Filled 2022-12-05: qty 2

## 2022-12-05 MED ORDER — SODIUM CHLORIDE 0.9 % IV SOLN: Freq: Once | INTRAVENOUS | Status: AC

## 2022-12-05 MED ORDER — SODIUM CHLORIDE 0.9 % IV SOLN
300.0000 mg | Freq: Once | INTRAVENOUS | Status: AC
  Administered 2022-12-05: 300 mg via INTRAVENOUS
  Filled 2022-12-05: qty 300

## 2022-12-05 MED ORDER — LORATADINE 10 MG PO TABS
10.0000 mg | ORAL_TABLET | Freq: Once | ORAL | Status: AC
  Administered 2022-12-05: 10 mg via ORAL
  Filled 2022-12-05: qty 1

## 2022-12-05 NOTE — Patient Instructions (Signed)

## 2022-12-11 ENCOUNTER — Telehealth: Payer: Self-pay | Admitting: Hematology

## 2022-12-11 ENCOUNTER — Ambulatory Visit: Payer: Medicare Other

## 2022-12-11 ENCOUNTER — Inpatient Hospital Stay: Payer: Medicare Other

## 2022-12-11 NOTE — Telephone Encounter (Signed)
Patient called to confirm 12/22 appointment time. Patient notified.

## 2022-12-15 ENCOUNTER — Other Ambulatory Visit (HOSPITAL_COMMUNITY): Payer: Self-pay

## 2022-12-17 ENCOUNTER — Inpatient Hospital Stay: Payer: Medicare Other

## 2022-12-17 ENCOUNTER — Other Ambulatory Visit: Payer: Self-pay

## 2022-12-17 ENCOUNTER — Ambulatory Visit: Payer: Medicare Other

## 2022-12-17 VITALS — BP 127/73 | HR 71 | Resp 16

## 2022-12-17 DIAGNOSIS — D500 Iron deficiency anemia secondary to blood loss (chronic): Secondary | ICD-10-CM | POA: Diagnosis not present

## 2022-12-17 MED ORDER — SODIUM CHLORIDE 0.9 % IV SOLN: Freq: Once | INTRAVENOUS | Status: AC

## 2022-12-17 MED ORDER — ACETAMINOPHEN 325 MG PO TABS
650.0000 mg | ORAL_TABLET | Freq: Once | ORAL | Status: AC
  Administered 2022-12-17: 650 mg via ORAL
  Filled 2022-12-17: qty 2

## 2022-12-17 MED ORDER — SODIUM CHLORIDE 0.9 % IV SOLN
300.0000 mg | Freq: Once | INTRAVENOUS | Status: AC
  Administered 2022-12-17: 300 mg via INTRAVENOUS
  Filled 2022-12-17: qty 300

## 2022-12-17 MED ORDER — LORATADINE 10 MG PO TABS
10.0000 mg | ORAL_TABLET | Freq: Once | ORAL | Status: AC
  Administered 2022-12-17: 10 mg via ORAL
  Filled 2022-12-17: qty 1

## 2022-12-17 NOTE — Patient Instructions (Signed)

## 2022-12-31 ENCOUNTER — Other Ambulatory Visit: Payer: Self-pay

## 2022-12-31 DIAGNOSIS — D500 Iron deficiency anemia secondary to blood loss (chronic): Secondary | ICD-10-CM

## 2023-01-01 ENCOUNTER — Inpatient Hospital Stay: Payer: Medicare Other

## 2023-01-01 ENCOUNTER — Inpatient Hospital Stay: Payer: Medicare Other | Admitting: Hematology

## 2023-01-01 NOTE — Progress Notes (Incomplete)
HEMATOLOGY/ONCOLOGY CLINIC NOTE  Date of Service: 01/01/23   Patient Care Team: Leslie Dales, DO as PCP - General (Family Medicine)   CHIEF COMPLAINTS/PURPOSE OF CONSULTATION:  Iron deficiency anemia   HISTORY OF PRESENTING ILLNESS:  Brandy Foster is a wonderful 47 y.o. female who has been referred to [place] by Dr. Harriet Butte for evaluation and management of Iron deficiency anemia. The pt reports that she is doing well overall.   The pt notes that she currently feels tired, and denies any overt light headedness or dizziness.   However, the pt reports that she has had sensations that she is about to pass out for a few months, and that this was worse in the summertime. She notes that she hasn't felt as though she was going to pass out recently. She notes she has intermittently taken PO Iron replacement for several years. She is taking one pill of '325mg'$  Ferrous Sulfate a day, but forgets some days, and takes it with food, but notes that she has some abdominal discomfort with PO Iron replacement. The pt notes that she has ice cravings.  The pt notes that she has heavy periods, which has only been the case for the past few months. She notes that she uses two pads simultaneously for the first couple days and her periods last about 5 days. The pt notes that she last saw her OBGYN about a year ago. She notes that she had small fibroids 10 years ago, which she discovered during her second pregnancy. She has had three pregnancies, two successful pregnancies 10 and 21 years ago, and one miscarriage a "few years ago." She denies any recent surgeries, and denies any blood in the stools or other blood loss.   The pt denies any dietary restrictions. She denies taking any acid suppressant medication.   The pt notes that she has glaucoma and uses nightly eye drops. She denies any other medical problems.   Most recent lab results (09/07/18) of CBC w/diff is as follows: all values are WNL except for  RBC at 3.63, HGB at 7.7, HCT at 26.4, MCV at 73, MCH at 21.2, MCHC at 29.2, RDW at 16.1. 09/15/18 Anemia Panel revealed all values WNL except for Iron at 20, Iron Saturation at 5%, HCT at 276, Ferritin at 3.   On review of systems, pt reports feeling tired, heavy periods, and denies blood in the stools, current light headedness or dizziness, black stools, abdominal pains, leg swelling, and any other symptoms.   On PMHx the pt reports two cesarean sections 21 and 10 years ago, Glaucoma. Denies liver problems.  On Family Hx the pt reports iron deficiency anemia and denies sickle cell disease, blood disorders   INTERVAL HISTORY: Brandy Foster is here today for follow up and treatment of her Iron Deficiency Anemia.   The patient's last visit with [place] was on 09/02/2022. She reported that she was doing well with no new symptoms or concerns.  Today,  -dicussed lab results from 01/01/23 with patient. CBC  CMP   MEDICAL HISTORY:  Past Medical History:  Diagnosis Date   Anemia    Anxiety    Chronic mental illness 06/19/2011   Pt states that she is on disability 2/2 mental illness but she doesn't know what her diagnosis is.  Pt is to request records from her psychiatrist.     Chronic mental illness 06/19/2011   Pt states that she is on disability 2/2 mental illness but she doesn't know  what her diagnosis is.  Pt is to request records from her psychiatrist.     Depression    Obesity     SURGICAL HISTORY: Past Surgical History:  Procedure Laterality Date   CESAREAN SECTION      SOCIAL HISTORY: Social History   Socioeconomic History   Marital status: Single    Spouse name: Not on file   Number of children: Not on file   Years of education: Not on file   Highest education level: Not on file  Occupational History   Not on file  Tobacco Use   Smoking status: Never   Smokeless tobacco: Never  Substance and Sexual Activity   Alcohol use: No   Drug use: No   Sexual activity: Yes     Birth control/protection: None  Other Topics Concern   Not on file  Social History Narrative   On disability- not sure what her disability is- for mental  Health reasons.  Unsure of diagnosis.     Mother gets disability check and gives pt the money. - mom pays bills and then gives her the rest.    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    Lives at rental home- has 2 children (ages 3 Manufacturing engineer) and 38 (jabaree))            Social Determinants of Radio broadcast assistant Strain: Not on file  Food Insecurity: Not on file  Transportation Needs: Not on file  Physical Activity: Not on file  Stress: Not on file  Social Connections: Not on file  Intimate Partner Violence: Not on file    FAMILY HISTORY: Family History  Problem Relation Age of Onset   Diabetes Mother    COPD Mother    Arthritis Mother    Hypertension Mother    Hyperlipidemia Father    Schizophrenia Brother     ALLERGIES:  has No Known Allergies.  MEDICATIONS:  Current Outpatient Medications  Medication Sig Dispense Refill   cetirizine (ZYRTEC) 10 MG tablet Take 1 tablet (10 mg total) by mouth daily as needed for allergies. 30 tablet 11   fluconazole (DIFLUCAN) 150 MG tablet Take 1 tablet now, then 1 tablet in 72 hours 2 tablet 0   fluticasone (FLONASE) 50 MCG/ACT nasal spray Place 2 sprays into both nostrils daily. 16 g 6   hydrOXYzine (ATARAX) 10 MG tablet TAKE 1 TABLET (10 MG TOTAL) BY MOUTH AT BEDTIME AS NEEDED FOR ANXIETY 90 tablet 1   iron polysaccharides (NIFEREX) 150 MG capsule Take 1 capsule (150 mg total) by mouth daily. 60 capsule 3   LATANOPROST OP Place 1 drop into both eyes at bedtime.     naproxen (NAPROSYN) 500 MG tablet Take 1 tablet (500 mg total) by mouth 2 (two) times daily. 20 tablet 0   No current facility-administered medications for this visit.    REVIEW OF SYSTEMS:    10 Point review of Systems  was done is negative except as noted above.    PHYSICAL EXAMINATION:  . There were no vitals filed for this visit.  There were no vitals filed for this visit.  There is no height or weight on file to calculate BMI.    GENERAL:alert, in no acute distress and comfortable SKIN: no acute rashes, no significant lesions EYES: conjunctiva are pink and non-injected, sclera anicteric OROPHARYNX: MMM, no exudates, no oropharyngeal erythema or ulceration NECK: supple, no JVD  LYMPH:  no palpable lymphadenopathy in the cervical, axillary or inguinal regions LUNGS: clear to auscultation b/l with normal respiratory effort HEART: regular rate & rhythm ABDOMEN:  normoactive bowel sounds , non tender, not distended. Extremity: no pedal edema PSYCH: alert & oriented x 3 with fluent speech NEURO: no focal motor/sensory deficits   LABORATORY DATA:  I have reviewed the data as listed  .    Latest Ref Rng & Units 09/02/2022   12:37 PM 08/20/2022    2:54 PM 08/03/2019   11:40 AM  CBC  WBC 4.0 - 10.5 K/uL 8.9  7.8  8.7   Hemoglobin 12.0 - 15.0 g/dL 9.0  8.0  10.5   Hematocrit 36.0 - 46.0 % 32.9  29.8  35.6   Platelets 150 - 400 K/uL 350  385  293     .    Latest Ref Rng & Units 09/02/2022   12:37 PM 08/20/2022    2:54 PM 08/03/2019   11:40 AM  CMP  Glucose 70 - 99 mg/dL 93  95  86   BUN 6 - 20 mg/dL '10  8  10   '$ Creatinine 0.44 - 1.00 mg/dL 0.75  0.71  0.79   Sodium 135 - 145 mmol/L 137  141  137   Potassium 3.5 - 5.1 mmol/L 4.0  4.0  4.1   Chloride 98 - 111 mmol/L 106  106  105   CO2 22 - 32 mmol/L '28  20  23   '$ Calcium 8.9 - 10.3 mg/dL 9.7  9.1  9.1   Total Protein 6.5 - 8.1 g/dL 7.0   6.9   Total Bilirubin 0.3 - 1.2 mg/dL 0.3   0.2   Alkaline Phos 38 - 126 U/L 59   55   AST 15 - 41 U/L 18   15   ALT 0 - 44 U/L 9   7    . Lab Results  Component Value Date   IRON 57 09/02/2022   TIBC 468 (H) 09/02/2022   IRONPCTSAT 12 09/02/2022   (Iron and TIBC)  Lab Results  Component  Value Date   FERRITIN 8 (L) 09/02/2022      RADIOGRAPHIC STUDIES: I have personally reviewed the radiological images as listed and agreed with the findings in the report. No results found.  ASSESSMENT & PLAN:   47 y.o. female with  1. Severe Iron deficiency anemia - related to menorrhagia  PLAN: -Discussed patient's most recent labs hgb down to 9 -today Anemia Panel revealed 12% Iron Saturation, and a Ferritin of 8 -Patient is significantly iron deficient, likely related to her menorrhagia -Advised that the pt follow up with OBGYN for further evaluation of her menorrhagia and history of fibroids -Will order IV Injectafer x weekly for 2 doses if patient agreeable -Recommend that the pt begin taking a Vitamin B complex OTC  FOLLOW UP: ***  The total time spent in the appointment was *** minutes* .  All of the patient's questions were answered with apparent satisfaction. The patient knows to call the clinic with any problems, questions or concerns.   Sullivan Lone MD MS AAHIVMS [facility] Hematology/Oncology Physician [facility]  .*Total Encounter Time as defined by the Centers for Medicare and Medicaid Services includes, in addition to the face-to-face time of a patient visit (documented in the note above) non-face-to-face time: obtaining and reviewing outside history, ordering and reviewing medications, tests or procedures, care coordination (communications with other health care professionals or caregivers)  and documentation in the medical record.   I,Mitra Faeizi,acting as a Education administrator for Sullivan Lone, MD.,have documented all relevant documentation on the behalf of Sullivan Lone, MD,as directed by  Sullivan Lone, MD while in the presence of Sullivan Lone, MD.  ***

## 2023-01-24 ENCOUNTER — Encounter: Payer: Self-pay | Admitting: Hematology

## 2023-02-08 ENCOUNTER — Telehealth: Payer: Self-pay | Admitting: Student

## 2023-02-08 NOTE — Telephone Encounter (Signed)
Called patient to schedule Medicare Annual Wellness Visit (AWV). Left message for patient to call back and schedule Medicare Annual Wellness Visit (AWV).  Last date of AWV: AWVI eligible as of  12/21/2009   Please schedule an appointment at any time with [facility].  If any questions, please contact me at [phone].    Thank you,  [place] Direct dial  [phone]

## 2023-02-08 NOTE — Telephone Encounter (Signed)
Contacted Brandy Foster to schedule their annual wellness visit. Appointment made for 02/16/2023.  Thank you,  [place] Direct dial  [phone]

## 2023-02-15 ENCOUNTER — Other Ambulatory Visit: Payer: Self-pay

## 2023-02-15 ENCOUNTER — Inpatient Hospital Stay (HOSPITAL_BASED_OUTPATIENT_CLINIC_OR_DEPARTMENT_OTHER): Payer: 59 | Admitting: Hematology

## 2023-02-15 ENCOUNTER — Inpatient Hospital Stay: Payer: 59 | Attending: Hematology

## 2023-02-15 VITALS — BP 136/71 | HR 78 | Temp 98.2°F | Resp 14 | Wt 287.5 lb

## 2023-02-15 DIAGNOSIS — D500 Iron deficiency anemia secondary to blood loss (chronic): Secondary | ICD-10-CM | POA: Insufficient documentation

## 2023-02-15 DIAGNOSIS — N920 Excessive and frequent menstruation with regular cycle: Secondary | ICD-10-CM | POA: Insufficient documentation

## 2023-02-15 DIAGNOSIS — H401131 Primary open-angle glaucoma, bilateral, mild stage: Secondary | ICD-10-CM | POA: Diagnosis not present

## 2023-02-15 DIAGNOSIS — H409 Unspecified glaucoma: Secondary | ICD-10-CM

## 2023-02-15 LAB — CMP (CANCER CENTER ONLY)
ALT: 7 U/L (ref 0–44)
AST: 11 U/L — ABNORMAL LOW (ref 15–41)
Albumin: 3.8 g/dL (ref 3.5–5.0)
Alkaline Phosphatase: 51 U/L (ref 38–126)
Anion gap: 6 (ref 5–15)
BUN: 11 mg/dL (ref 6–20)
CO2: 25 mmol/L (ref 22–32)
Calcium: 8.5 mg/dL — ABNORMAL LOW (ref 8.9–10.3)
Chloride: 106 mmol/L (ref 98–111)
Creatinine: 0.64 mg/dL (ref 0.44–1.00)
GFR, Estimated: >60 mL/min (ref >60)
Glucose, Bld: 97 mg/dL (ref 70–99)
Potassium: 4.2 mmol/L (ref 3.5–5.1)
Sodium: 137 mmol/L (ref 135–145)
Total Bilirubin: 0.3 mg/dL (ref 0.3–1.2)
Total Protein: 6.8 g/dL (ref 6.5–8.1)

## 2023-02-15 LAB — CBC WITH DIFFERENTIAL (CANCER CENTER ONLY)
Abs Immature Granulocytes: 0.02 10*3/uL (ref 0.00–0.07)
Basophils Absolute: 0 10*3/uL (ref 0.0–0.1)
Basophils Relative: 1 %
Eosinophils Absolute: 0.2 10*3/uL (ref 0.0–0.5)
Eosinophils Relative: 3 %
HCT: 32.6 % — ABNORMAL LOW (ref 36.0–46.0)
Hemoglobin: 9.6 g/dL — ABNORMAL LOW (ref 12.0–15.0)
Immature Granulocytes: 0 %
Lymphocytes Relative: 31 %
Lymphs Abs: 2.6 10*3/uL (ref 0.7–4.0)
MCH: 25.7 pg — ABNORMAL LOW (ref 26.0–34.0)
MCHC: 29.4 g/dL — ABNORMAL LOW (ref 30.0–36.0)
MCV: 87.4 fL (ref 80.0–100.0)
Monocytes Absolute: 0.6 10*3/uL (ref 0.1–1.0)
Monocytes Relative: 7 %
Neutro Abs: 5 10*3/uL (ref 1.7–7.7)
Neutrophils Relative %: 58 %
Platelet Count: 321 10*3/uL (ref 150–400)
RBC: 3.73 MIL/uL — ABNORMAL LOW (ref 3.87–5.11)
RDW: 16.7 % — ABNORMAL HIGH (ref 11.5–15.5)
WBC Count: 8.5 10*3/uL (ref 4.0–10.5)
nRBC: 0 % (ref 0.0–0.2)

## 2023-02-15 LAB — IRON AND IRON BINDING CAPACITY (CC-WL,HP ONLY)
Iron: 35 ug/dL (ref 28–170)
Saturation Ratios: 8 % — ABNORMAL LOW (ref 10.4–31.8)
TIBC: 462 ug/dL — ABNORMAL HIGH (ref 250–450)
UIBC: 427 ug/dL (ref 148–442)

## 2023-02-15 LAB — FERRITIN: Ferritin: 5 ng/mL — ABNORMAL LOW (ref 11–307)

## 2023-02-15 NOTE — Progress Notes (Signed)
HEMATOLOGY/ONCOLOGY CLINIC NOTE  Date of Service: 02/15/23  Patient Care Team: Leslie Dales, DO as PCP - General (Family Medicine)   CHIEF COMPLAINTS/PURPOSE OF CONSULTATION:  Iron deficiency anemia   HISTORY OF PRESENTING ILLNESS:  Brandy Foster is a wonderful 47 y.o. female who has been referred to [place] by Dr. Harriet Butte for evaluation and management of Iron deficiency anemia. The pt reports that she is doing well overall.   The pt notes that she currently feels tired, and denies any overt light headedness or dizziness.   However, the pt reports that she has had sensations that she is about to pass out for a few months, and that this was worse in the summertime. She notes that she hasn't felt as though she was going to pass out recently. She notes she has intermittently taken PO Iron replacement for several years. She is taking one pill of '325mg'$  Ferrous Sulfate a day, but forgets some days, and takes it with food, but notes that she has some abdominal discomfort with PO Iron replacement. The pt notes that she has ice cravings.  The pt notes that she has heavy periods, which has only been the case for the past few months. She notes that she uses two pads simultaneously for the first couple days and her periods last about 5 days. The pt notes that she last saw her OBGYN about a year ago. She notes that she had small fibroids 10 years ago, which she discovered during her second pregnancy. She has had three pregnancies, two successful pregnancies 10 and 21 years ago, and one miscarriage a "few years ago." She denies any recent surgeries, and denies any blood in the stools or other blood loss.   The pt denies any dietary restrictions. She denies taking any acid suppressant medication.   The pt notes that she has glaucoma and uses nightly eye drops. She denies any other medical problems.   Most recent lab results (09/07/18) of CBC w/diff is as follows: all values are WNL except for  RBC at 3.63, HGB at 7.7, HCT at 26.4, MCV at 73, MCH at 21.2, MCHC at 29.2, RDW at 16.1. 09/15/18 Anemia Panel revealed all values WNL except for Iron at 20, Iron Saturation at 5%, HCT at 276, Ferritin at 3.   On review of systems, pt reports feeling tired, heavy periods, and denies blood in the stools, current light headedness or dizziness, black stools, abdominal pains, leg swelling, and any other symptoms.   On PMHx the pt reports two cesarean sections 21 and 10 years ago, Glaucoma. Denies liver problems.  On Family Hx the pt reports iron deficiency anemia and denies sickle cell disease, blood disorders   INTERVAL HISTORY:  Brandy Foster is here today for follow up and treatment of her Iron Deficiency Anemia.   Patient was last seen by me on 09/02/2022 and she was doing well overall.   Patient reports she has been doing well overall without any new medical concerns since our last visit. She denies fever, chills, night sweats, fatigue, dizziness, abdominal pain, chest pain, abnormal bleeding, back pain, or leg swelling.  Patient reports she is having heavy menstrual cycle. She notes her menstrual cycle lasts 6 days and the first 3  days are heaviest. She denies of having an OBGYN.   MEDICAL HISTORY:  Past Medical History:  Diagnosis Date   Anemia    Anxiety    Chronic mental illness 06/19/2011   Pt states that  she is on disability 2/2 mental illness but she doesn't know what her diagnosis is.  Pt is to request records from her psychiatrist.     Chronic mental illness 06/19/2011   Pt states that she is on disability 2/2 mental illness but she doesn't know what her diagnosis is.  Pt is to request records from her psychiatrist.     Depression    Obesity     SURGICAL HISTORY: Past Surgical History:  Procedure Laterality Date   CESAREAN SECTION      SOCIAL HISTORY: Social History   Socioeconomic History   Marital status: Single    Spouse name: Not on file   Number of  children: Not on file   Years of education: Not on file   Highest education level: Not on file  Occupational History   Not on file  Tobacco Use   Smoking status: Never   Smokeless tobacco: Never  Substance and Sexual Activity   Alcohol use: No   Drug use: No   Sexual activity: Yes    Birth control/protection: None  Other Topics Concern   Not on file  Social History Narrative   On disability- not sure what her disability is- for mental  Health reasons.  Unsure of diagnosis.     Mother gets disability check and gives pt the money. - mom pays bills and then gives her the rest.    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    Lives at rental home- has 2 children (ages 60 Manufacturing engineer) and 45 ([place]))            Social Determinants of Radio broadcast assistant Strain: Not on file  Food Insecurity: Not on file  Transportation Needs: Not on file  Physical Activity: Not on file  Stress: Not on file  Social Connections: Not on file  Intimate Partner Violence: Not on file    FAMILY HISTORY: Family History  Problem Relation Age of Onset   Diabetes Mother    COPD Mother    Arthritis Mother    Hypertension Mother    Hyperlipidemia Father    Schizophrenia Brother     ALLERGIES:  has No Known Allergies.  MEDICATIONS:  Current Outpatient Medications  Medication Sig Dispense Refill   cetirizine (ZYRTEC) 10 MG tablet Take 1 tablet (10 mg total) by mouth daily as needed for allergies. 30 tablet 11   fluconazole (DIFLUCAN) 150 MG tablet Take 1 tablet now, then 1 tablet in 72 hours 2 tablet 0   fluticasone (FLONASE) 50 MCG/ACT nasal spray Place 2 sprays into both nostrils daily. 16 g 6   hydrOXYzine (ATARAX) 10 MG tablet TAKE 1 TABLET (10 MG TOTAL) BY MOUTH AT BEDTIME AS NEEDED FOR ANXIETY 90 tablet 1   iron polysaccharides (NIFEREX) 150 MG capsule Take 1 capsule (150 mg total) by mouth daily. 60  capsule 3   LATANOPROST OP Place 1 drop into both eyes at bedtime.     naproxen (NAPROSYN) 500 MG tablet Take 1 tablet (500 mg total) by mouth 2 (two) times daily. 20 tablet 0   No current facility-administered medications for this visit.    REVIEW OF SYSTEMS:   A 10+ POINT REVIEW OF SYSTEMS WAS OBTAINED including neurology, dermatology, psychiatry, cardiac, respiratory, lymph, extremities, GI, GU, Musculoskeletal, constitutional, breasts, reproductive, HEENT.  All pertinent positives are noted in the HPI.  All others are negative.  PHYSICAL EXAMINATION:  . Vitals:   02/15/23 1304  BP: 136/71  Pulse: 78  Resp: 14  Temp: 98.2 F (36.8 C)  SpO2: 100%   Filed Weights   02/15/23 1304  Weight: 287 lb 8 oz (130.4 kg)  Body mass index is 43.71 kg/m. NAD GENERAL:alert, in no acute distress and comfortable SKIN: no acute rashes, no significant lesions EYES: conjunctiva are pink and non-injected, sclera anicteric NECK: supple, no JVD LYMPH:  no palpable lymphadenopathy in the cervical, axillary or inguinal regions LUNGS: clear to auscultation b/l with normal respiratory effort HEART: regular rate & rhythm ABDOMEN:  normoactive bowel sounds , non tender, not distended. Extremity: no pedal edema PSYCH: alert & oriented x 3 with fluent speech NEURO: no focal motor/sensory deficits   LABORATORY DATA:  I have reviewed the data as listed  .    Latest Ref Rng & Units 02/15/2023   12:02 PM 09/02/2022   12:37 PM 08/20/2022    2:54 PM  CBC  WBC 4.0 - 10.5 K/uL 8.5  8.9  7.8   Hemoglobin 12.0 - 15.0 g/dL 9.6  9.0  8.0   Hematocrit 36.0 - 46.0 % 32.6  32.9  29.8   Platelets 150 - 400 K/uL 321  350  385     .    Latest Ref Rng & Units 02/15/2023   12:02 PM 09/02/2022   12:37 PM 08/20/2022    2:54 PM  CMP  Glucose 70 - 99 mg/dL 97  93  95   BUN 6 - 20 mg/dL '11  10  8   '$ Creatinine 0.44 - 1.00 mg/dL 0.64  0.75  0.71   Sodium 135 - 145 mmol/L 137  137  141   Potassium 3.5 - 5.1  mmol/L 4.2  4.0  4.0   Chloride 98 - 111 mmol/L 106  106  106   CO2 22 - 32 mmol/L '25  28  20   '$ Calcium 8.9 - 10.3 mg/dL 8.5  9.7  9.1   Total Protein 6.5 - 8.1 g/dL 6.8  7.0    Total Bilirubin 0.3 - 1.2 mg/dL 0.3  0.3    Alkaline Phos 38 - 126 U/L 51  59    AST 15 - 41 U/L 11  18    ALT 0 - 44 U/L 7  9     . Lab Results  Component Value Date   IRON 35 02/15/2023   TIBC 462 (H) 02/15/2023   IRONPCTSAT 8 (L) 02/15/2023   (Iron and TIBC)  Lab Results  Component Value Date   FERRITIN 5 (L) 02/15/2023      RADIOGRAPHIC STUDIES: I have personally reviewed the radiological images as listed and agreed with the findings in the report. No results found.  ASSESSMENT & PLAN:   47 y.o. female with  1. Severe Iron deficiency anemia - related to menorrhagia  PLAN: -Discussed lab results from today, 02/15/2023, with the patient. CBC shows that the patient is anemic with hemoglobin of 9.6 and hematocrit of 32.6. CMP is stable, except slightly decreased calcium levels of 8.5 and decreased AST level of 11. Iron saturation rate is 8%. Patient is iron deficient.  -Recommended to follow-up with pcp and get a referral for OBGYN regarding heavy menstrual cycle.  -Recommended IV Iron due to iron deficient with anemia. Patient agrees to receive IV Iron.  -Patient can start taking iron supplement.   FOLLOW-UP: IV venofer '300mg'$  weekly x 4 doses Phone visit with Dr Irene Limbo  in 4 months with labs 1 day prior to phone visit  The total time spent in the appointment was 20 minutes* .  All of the patient's questions were answered with apparent satisfaction. The patient knows to call the clinic with any problems, questions or concerns.   Sullivan Lone MD MS AAHIVMS [facility], [facility], [facility] Hematology/Oncology Physician [facility]  .*Total Encounter Time as defined by the Centers for Medicare and Medicaid Services includes, in addition to the face-to-face time of a patient visit (documented in the note  above) non-face-to-face time: obtaining and reviewing outside history, ordering and reviewing medications, tests or procedures, care coordination (communications with other health care professionals or caregivers) and documentation in the medical record.   I, Cleda Mccreedy, am acting as a Education administrator for Sullivan Lone, MD. .I have reviewed the above documentation for accuracy and completeness, and I agree with the above. Brunetta Genera MD

## 2023-02-15 NOTE — Progress Notes (Unsigned)
I connected with  Brandy Foster on 02/16/2023 by a audio enabled telemedicine application and verified that I am speaking with the correct person using two identifiers.  Patient Location: Home  Provider Location: Home Office  I discussed the limitations of evaluation and management by telemedicine. The patient expressed understanding and agreed to proceed.  Subjective:   Brandy Foster is a 47 y.o. female who presents for an Initial Medicare Annual Wellness Visit.  Review of Systems    Per HPI unless specifically indicated below.        Objective:       02/15/2023    1:04 PM 12/17/2022   11:25 AM 12/17/2022    8:35 AM  Vitals with BMI  Weight 287 lbs 8 oz    Systolic [id number]  Diastolic 71 73 90  Pulse 78 71 81    There were no vitals filed for this visit. There is no height or weight on file to calculate BMI.     09/09/2022   11:06 AM 08/20/2022    1:56 PM 07/27/2022    1:46 PM 04/13/2022    3:13 PM 06/07/2020   11:22 AM 09/07/2018    3:49 PM 12/06/2017    2:15 PM  Advanced Directives  Does Patient Have a Medical Advance Directive? No No No No Yes No No  Type of Advance Directive     Healthcare Power of Attorney    Would patient like information on creating a medical advance directive? No - Patient declined No - Patient declined No - Patient declined   No - Patient declined No - Patient declined    Current Medications (verified) Outpatient Encounter Medications as of 02/16/2023  Medication Sig   cetirizine (ZYRTEC) 10 MG tablet Take 1 tablet (10 mg total) by mouth daily as needed for allergies.   hydrOXYzine (ATARAX) 10 MG tablet TAKE 1 TABLET (10 MG TOTAL) BY MOUTH AT BEDTIME AS NEEDED FOR ANXIETY   LATANOPROST OP Place 1 drop into both eyes at bedtime.   naproxen (NAPROSYN) 500 MG tablet Take 1 tablet (500 mg total) by mouth 2 (two) times daily.   fluconazole (DIFLUCAN) 150 MG tablet Take 1 tablet now, then 1 tablet in 72 hours (Patient not taking:  Reported on 02/16/2023)   fluticasone (FLONASE) 50 MCG/ACT nasal spray Place 2 sprays into both nostrils daily. (Patient not taking: Reported on 02/16/2023)   iron polysaccharides (NIFEREX) 150 MG capsule Take 1 capsule (150 mg total) by mouth daily. (Patient not taking: Reported on 02/16/2023)   No facility-administered encounter medications on file as of 02/16/2023.    Allergies (verified) Patient has no known allergies.   History: Past Medical History:  Diagnosis Date   Anemia    Anxiety    Chronic mental illness 06/19/2011   Pt states that she is on disability 2/2 mental illness but she doesn't know what her diagnosis is.  Pt is to request records from her psychiatrist.     Chronic mental illness 06/19/2011   Pt states that she is on disability 2/2 mental illness but she doesn't know what her diagnosis is.  Pt is to request records from her psychiatrist.     Depression    Obesity    Past Surgical History:  Procedure Laterality Date   CESAREAN SECTION     Family History  Problem Relation Age of Onset   Diabetes Mother    COPD Mother    Arthritis Mother  Hypertension Mother    Hyperlipidemia Father    Schizophrenia Brother    Social History   Socioeconomic History   Marital status: Single    Spouse name: Not on file   Number of children: 2   Years of education: Not on file   Highest education level: Not on file  Occupational History   Occupation: Disability  Tobacco Use   Smoking status: Never   Smokeless tobacco: Never  Vaping Use   Vaping Use: Never used  Substance and Sexual Activity   Alcohol use: No   Drug use: No   Sexual activity: Yes    Birth control/protection: None  Other Topics Concern   Not on file  Social History Narrative   On disability- not sure what her disability is- for mental  Health reasons.  Unsure of diagnosis.     Mother gets disability check and gives pt the money. - mom pays bills and then gives her the rest.    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    Lives at rental home- has 2 children (ages 31 (melanie) and 45 [place]))            Social Determinants of Health   Financial Resource Strain: Low Risk  (02/16/2023)   Overall Financial Resource Strain (CARDIA)    Difficulty of Paying Living Expenses: Not hard at all  Food Insecurity: No Food Insecurity (02/16/2023)   Hunger Vital Sign    Worried About Running Out of Food in the Last Year: Never true    Ran Out of Food in the Last Year: Never true  Transportation Needs: No Transportation Needs (02/16/2023)   PRAPARE - Hydrologist (Medical): No    Lack of Transportation (Non-Medical): No  Physical Activity: Insufficiently Active (02/16/2023)   Exercise Vital Sign    Days of Exercise per Week: 3 days    Minutes of Exercise per Session: 30 min  Stress: No Stress Concern Present (02/16/2023)   [place]    Feeling of Stress : Not at all  Social Connections: Socially Isolated (02/16/2023)   Social Connection and Isolation Panel [NHANES]    Frequency of Communication with Friends and Family: More than three times a week    Frequency of Social Gatherings with Friends and Family: Twice a week    Attends Religious Services: Never    Marine scientist or Organizations: No    Attends Archivist Meetings: Never    Marital Status: Never married    Tobacco Counseling Counseling given: Not Answered   Clinical Intake:The pt refused to complete the Wellness Visit            Diabetic?No         Activities of Daily Living    02/16/2023   10:31 AM  In your present state of health, do you have any difficulty performing the following activities:  Hearing? 0  Vision? 1  Difficulty concentrating or making decisions? 0  Walking or climbing stairs? 0  Dressing or  bathing? 0  Doing errands, shopping? 0    Patient Care Team: Leslie Dales, DO as PCP - General (Family Medicine)  Indicate any recent Medical Services you may have received from other than [facility] providers in the past year (date may be approximate).     Assessment:   This is a routine  wellness examination for [facility].   Hearing/Vision screen Denies any hearing issues. Denies any change to her vision. Wear glasses. Annual Eye Exam.   Dietary issues and exercise activities discussed: Current Exercise Habits: Home exercise routine, Type of exercise: walking, Time (Minutes): 30, Frequency (Times/Week): 3, Weekly Exercise (Minutes/Week): 90, Intensity: Moderate, Exercise limited by: None identified   Goals Addressed   None   Depression Screen    02/16/2023   10:30 AM 09/09/2022   11:06 AM 08/20/2022    2:42 PM 07/27/2022    2:22 PM 11/15/2019    3:01 PM 10/27/2019    2:48 PM 09/07/2018    3:49 PM  PHQ 2/9 Scores  PHQ - 2 Score 0 0 2 4 0 0 0  PHQ- 9 Score  '1 4 11       '$ Fall Risk    02/16/2023   10:30 AM 08/20/2022    1:56 PM 11/15/2019    3:01 PM 10/27/2019    2:48 PM 12/06/2017    2:15 PM  Fall Risk   Falls in the past year? 0 0 1 1 No  Number falls in past yr: 0 0 0 0   Injury with Fall? 0 0 1 1   Comment    cut in head   Risk for fall due to : No Fall Risks      Follow up Falls evaluation completed  Follow up appointment Follow up appointment   Comment   md informed MD informed     FALL RISK PREVENTION PERTAINING TO THE HOME: The pt refuse to complete the Wellness Visit, Unable to assess Any stairs in or around the home?  The pt refuse to complete the Wellness Visit, unable to assess. If so, are there any without handrails?  Home free of loose throw rugs in walkways, pet beds, electrical cords, etc?  Adequate lighting in your home to reduce risk of falls?   ASSISTIVE DEVICES UTILIZED TO PREVENT FALLS:  Life alert?  Use of a cane, walker or w/c?  Grab bars in  the bathroom?  Shower chair or bench in shower?  Elevated toilet seat or a handicapped toilet?  TIMED UP AND GO:  Was the test performed?Unable to perform, virtual appointment   Cognitive Function:        02/16/2023   10:31 AM  6CIT Screen  What Year? 0 points  What month? 0 points  What time? 0 points   The pt refuse to complete the screening, unable to assess. Immunizations Immunization History  Administered Date(s) Administered   Td 01/21/2007   Tdap 10/19/2019    TDAP status: Up to date  Flu Vaccine status: Due, Education has been provided regarding the importance of this vaccine. Advised may receive this vaccine at local pharmacy or Health Dept. Aware to provide a copy of the vaccination record if obtained from local pharmacy or Health Dept. Verbalized acceptance and understanding.    Covid-19 vaccine status: Information provided on how to obtain vaccines.   Qualifies for Shingles Vaccine? No   Screening Tests Health Maintenance  Topic Date Due   COVID-19 Vaccine (1) Never done   PAP SMEAR-Modifier  06/12/2017   COLONOSCOPY (Pts 45-54yr Insurance coverage will need to be confirmed)  Never done   INFLUENZA VACCINE  03/21/2023 (Originally 07/21/2022)   Medicare Annual Wellness (AWV)  02/17/2024   DTaP/Tdap/Td (3 - Td or Tdap) 10/18/2029   Hepatitis C Screening  Completed   HIV Screening  Completed   HPV VACCINES  Aged Out    Health Maintenance  Health Maintenance Due  Topic Date Due   COVID-19 Vaccine (1) Never done   PAP SMEAR-Modifier  06/12/2017   COLONOSCOPY (Pts 45-19yr Insurance coverage will need to be confirmed)  Never done    Colonoscopy: Refused  Mammogram: Refused Lung Cancer Screening: (Low Dose CT Chest recommended if Age 47-80years, 30 pack-year currently smoking OR have quit w/in 15years.) does not qualify.   Lung Cancer Screening Referral: not aplicable  Additional Screening:  Hepatitis C Screening: does qualify; Completed  12/10/2020  Vision Screening: Recommended annual ophthalmology exams for early detection of glaucoma and other disorders of the eye. Is the patient up to date with their annual eye exam?  Yes  Who is the provider or what is the name of the office in which the patient attends annual eye exams[facility] not [facility] a provider, would they like to be referred to a provider to establish care? No .   Dental Screening: Recommended annual dental exams for proper oral hygiene  Community Resource Referral / Chronic Care Management: CRR required this visit?  No   CCM required this visit?  No      Plan:     I have personally reviewed and noted the following in the patient's chart:   Medical and social history Use of alcohol, tobacco or illicit drugs  Current medications and supplements including opioid prescriptions. Patient is not currently taking opioid prescriptions. Functional ability and status Nutritional status Physical activity Advanced directives List of other physicians Hospitalizations, surgeries, and ER visits in previous 12 months Vitals Screenings to include cognitive, depression, and falls Referrals and appointments  In addition, I have reviewed and discussed with patient certain preventive protocols, quality metrics, and best practice recommendations. A written personalized care plan for preventive services as well as general preventive health recommendations were provided to patient.    Ms. HWolsky, Thank you for taking time to come for your Medicare Wellness Visit. I appreciate your ongoing commitment to your health goals. Please review the following plan we discussed and let me know if I can assist you in the future.   These are the goals we discussed:  Goals   None     This is a list of the screening recommended for you and due dates:  Health Maintenance  Topic Date Due   COVID-19 Vaccine (1) Never done   Pap Smear  06/12/2017    Colon Cancer Screening  Never done   Flu Shot  03/21/2023*   Medicare Annual Wellness Visit  02/17/2024   DTaP/Tdap/Td vaccine (3 - Td or Tdap) 10/18/2029   Hepatitis C Screening: USPSTF Recommendation to screen - Ages 149-79yo.  Completed   HIV Screening  Completed   HPV Vaccine  Aged Out  *Topic was postponed. The date shown is not the original due date.      KWilson Singer [place]  02/16/2023  Nurse Notes: Approximately 30 minute Non-Face -To-Face Medicare Wellness Visit  The call was disconnected during the Cognitive Screening. I called the patient back to make sure the call was not dropped. She informed me that she didn't want to proceed with the phone call. I had explained to her before the call was disconnected the purpose of the visit after you inquired. I notified the patient that it was okay, I will just document that she doesn't want to complete the visit. She verbalized understanding and disconnected the  call again.

## 2023-02-16 ENCOUNTER — Ambulatory Visit (INDEPENDENT_AMBULATORY_CARE_PROVIDER_SITE_OTHER): Payer: 59

## 2023-02-16 DIAGNOSIS — Z0000 Encounter for general adult medical examination without abnormal findings: Secondary | ICD-10-CM | POA: Diagnosis not present

## 2023-02-17 NOTE — Progress Notes (Signed)
I reviewed the Medications, Problem list, Past Medical, Surgical Histories, Family Histories, and Social Histories.  I reviewed the nurse note.

## 2023-02-21 ENCOUNTER — Encounter: Payer: Self-pay | Admitting: Hematology

## 2023-03-05 ENCOUNTER — Inpatient Hospital Stay: Payer: 59 | Attending: Hematology

## 2023-03-12 ENCOUNTER — Telehealth: Payer: Self-pay | Admitting: Hematology

## 2023-03-12 ENCOUNTER — Inpatient Hospital Stay: Payer: 59

## 2023-03-12 NOTE — Telephone Encounter (Signed)
Called patient per 3/22 secure chat about no showed appointment to reschedule. Patient rescheduled and notified.

## 2023-03-19 ENCOUNTER — Inpatient Hospital Stay: Payer: 59

## 2023-03-24 DIAGNOSIS — M25561 Pain in right knee: Secondary | ICD-10-CM | POA: Diagnosis not present

## 2023-03-24 DIAGNOSIS — M25562 Pain in left knee: Secondary | ICD-10-CM | POA: Diagnosis not present

## 2023-03-26 ENCOUNTER — Inpatient Hospital Stay: Payer: 59 | Attending: Hematology

## 2023-04-02 ENCOUNTER — Inpatient Hospital Stay: Payer: 59

## 2023-05-03 ENCOUNTER — Other Ambulatory Visit: Payer: Self-pay | Admitting: *Deleted

## 2023-05-03 DIAGNOSIS — D500 Iron deficiency anemia secondary to blood loss (chronic): Secondary | ICD-10-CM

## 2023-05-04 ENCOUNTER — Inpatient Hospital Stay: Payer: 59 | Attending: Hematology

## 2023-05-04 DIAGNOSIS — D500 Iron deficiency anemia secondary to blood loss (chronic): Secondary | ICD-10-CM | POA: Insufficient documentation

## 2023-05-04 DIAGNOSIS — N920 Excessive and frequent menstruation with regular cycle: Secondary | ICD-10-CM | POA: Insufficient documentation

## 2023-05-05 ENCOUNTER — Telehealth: Payer: Self-pay | Admitting: Hematology

## 2023-05-05 ENCOUNTER — Inpatient Hospital Stay: Payer: 59 | Admitting: Hematology

## 2023-05-10 ENCOUNTER — Inpatient Hospital Stay: Payer: 59

## 2023-05-10 ENCOUNTER — Other Ambulatory Visit: Payer: Self-pay

## 2023-05-10 VITALS — BP 101/77 | HR 79 | Temp 98.1°F | Resp 18

## 2023-05-10 DIAGNOSIS — D500 Iron deficiency anemia secondary to blood loss (chronic): Secondary | ICD-10-CM

## 2023-05-10 DIAGNOSIS — N920 Excessive and frequent menstruation with regular cycle: Secondary | ICD-10-CM | POA: Diagnosis present

## 2023-05-10 MED ORDER — LORATADINE 10 MG PO TABS
10.0000 mg | ORAL_TABLET | Freq: Once | ORAL | Status: AC
  Administered 2023-05-10: 10 mg via ORAL
  Filled 2023-05-10: qty 1

## 2023-05-10 MED ORDER — SODIUM CHLORIDE 0.9 % IV SOLN: Freq: Once | INTRAVENOUS | Status: AC

## 2023-05-10 MED ORDER — SODIUM CHLORIDE 0.9 % IV SOLN
300.0000 mg | Freq: Once | INTRAVENOUS | Status: AC
  Administered 2023-05-10: 300 mg via INTRAVENOUS
  Filled 2023-05-10: qty 200

## 2023-05-10 MED ORDER — ACETAMINOPHEN 325 MG PO TABS
650.0000 mg | ORAL_TABLET | Freq: Once | ORAL | Status: AC
  Administered 2023-05-10: 650 mg via ORAL
  Filled 2023-05-10: qty 2

## 2023-05-10 NOTE — Patient Instructions (Signed)
[facility]  Discharge Instructions: Thank you for choosing [facility] to provide your oncology and hematology care.   If you have a lab appointment with the Cancer Center, please go directly to the Cancer Center and check in at the registration area.   Wear comfortable clothing and clothing appropriate for easy access to any Portacath or PICC line.   We strive to give you quality time with your provider. You may need to reschedule your appointment if you arrive late (15 or more minutes).  Arriving late affects you and other patients whose appointments are after yours.  Also, if you miss three or more appointments without notifying the office, you may be dismissed from the clinic at the provider's discretion.      For prescription refill requests, have your pharmacy contact our office and allow 72 hours for refills to be completed.    Today you received the following chemotherapy and/or immunotherapy agents: Feraheme      To help prevent nausea and vomiting after your treatment, we encourage you to take your nausea medication as directed.  BELOW ARE SYMPTOMS THAT SHOULD BE REPORTED IMMEDIATELY: *FEVER GREATER THAN 100.4 F (38 C) OR HIGHER *CHILLS OR SWEATING *NAUSEA AND VOMITING THAT IS NOT CONTROLLED WITH YOUR NAUSEA MEDICATION *UNUSUAL SHORTNESS OF BREATH *UNUSUAL BRUISING OR BLEEDING *URINARY PROBLEMS (pain or burning when urinating, or frequent urination) *BOWEL PROBLEMS (unusual diarrhea, constipation, pain near the anus) TENDERNESS IN MOUTH AND THROAT WITH OR WITHOUT PRESENCE OF ULCERS (sore throat, sores in mouth, or a toothache) UNUSUAL RASH, SWELLING OR PAIN  UNUSUAL VAGINAL DISCHARGE OR ITCHING   Items with * indicate a potential emergency and should be followed up as soon as possible or go to the Emergency Department if any problems should occur.  Please show the CHEMOTHERAPY ALERT CARD or IMMUNOTHERAPY ALERT CARD at  check-in to the Emergency Department and triage nurse.  Should you have questions after your visit or need to cancel or reschedule your appointment, please contact [facility]  Dept: [phone]  and follow the prompts.  Office hours are 8:00 a.m. to 4:30 p.m. Monday - Friday. Please note that voicemails left after 4:00 p.m. may not be returned until the following business day.  We are closed weekends and major holidays. You have access to a nurse at all times for urgent questions. Please call the main number to the clinic Dept: [phone] and follow the prompts.   For any non-urgent questions, you may also contact your provider using MyChart. We now offer e-Visits for anyone 18 and older to request care online for non-urgent symptoms. For details visit [email or web address].   Also download the MyChart app! Go to the app store, search "MyChart", open the app, select , and log in with your MyChart username and password.   

## 2023-05-26 ENCOUNTER — Inpatient Hospital Stay: Payer: 59 | Attending: Hematology

## 2023-06-03 DIAGNOSIS — J019 Acute sinusitis, unspecified: Secondary | ICD-10-CM | POA: Diagnosis not present

## 2023-06-03 DIAGNOSIS — R0981 Nasal congestion: Secondary | ICD-10-CM | POA: Diagnosis not present

## 2023-06-03 DIAGNOSIS — R053 Chronic cough: Secondary | ICD-10-CM | POA: Diagnosis not present

## 2023-06-03 DIAGNOSIS — H6692 Otitis media, unspecified, left ear: Secondary | ICD-10-CM | POA: Diagnosis not present

## 2023-06-09 ENCOUNTER — Encounter: Payer: Self-pay | Admitting: Hematology

## 2023-06-09 NOTE — Progress Notes (Signed)
This encounter was created in error - please disregard.

## 2023-06-10 DIAGNOSIS — H40153 Residual stage of open-angle glaucoma, bilateral: Secondary | ICD-10-CM | POA: Diagnosis not present

## 2023-06-11 ENCOUNTER — Inpatient Hospital Stay: Payer: 59

## 2023-06-11 ENCOUNTER — Telehealth: Payer: Self-pay | Admitting: Hematology

## 2023-07-01 ENCOUNTER — Telehealth: Payer: Self-pay | Admitting: Hematology

## 2023-07-02 ENCOUNTER — Inpatient Hospital Stay: Payer: 59

## 2023-07-02 DIAGNOSIS — H401131 Primary open-angle glaucoma, bilateral, mild stage: Secondary | ICD-10-CM | POA: Diagnosis not present

## 2023-07-09 ENCOUNTER — Inpatient Hospital Stay: Payer: 59 | Attending: Hematology

## 2023-07-09 ENCOUNTER — Other Ambulatory Visit: Payer: Self-pay

## 2023-07-09 VITALS — BP 118/74 | HR 87 | Temp 98.4°F | Resp 16

## 2023-07-09 DIAGNOSIS — N920 Excessive and frequent menstruation with regular cycle: Secondary | ICD-10-CM | POA: Insufficient documentation

## 2023-07-09 DIAGNOSIS — D500 Iron deficiency anemia secondary to blood loss (chronic): Secondary | ICD-10-CM | POA: Insufficient documentation

## 2023-07-09 MED ORDER — SODIUM CHLORIDE 0.9 % IV SOLN: Freq: Once | INTRAVENOUS | Status: AC

## 2023-07-09 MED ORDER — ACETAMINOPHEN 325 MG PO TABS
650.0000 mg | ORAL_TABLET | Freq: Once | ORAL | Status: AC
  Administered 2023-07-09: 650 mg via ORAL

## 2023-07-09 MED ORDER — LORATADINE 10 MG PO TABS
10.0000 mg | ORAL_TABLET | Freq: Once | ORAL | Status: AC
  Administered 2023-07-09: 10 mg via ORAL

## 2023-07-09 MED ORDER — SODIUM CHLORIDE 0.9 % IV SOLN
300.0000 mg | Freq: Once | INTRAVENOUS | Status: AC
  Administered 2023-07-09: 300 mg via INTRAVENOUS
  Filled 2023-07-09: qty 300

## 2023-07-09 NOTE — Progress Notes (Signed)
Pt observed for 15 minutes post Venofer infusion. Pt tolerated tx well w/out incident. VSS at discharge.  Ambulatory to lobby.

## 2023-07-09 NOTE — Patient Instructions (Signed)
Iron Sucrose Injection What is this medication? IRON SUCROSE (EYE ern SOO krose) treats low levels of iron (iron deficiency anemia) in people with kidney disease. Iron is a mineral that plays an important role in making red blood cells, which carry oxygen from your lungs to the rest of your body. This medicine may be used for other purposes; ask your health care provider or pharmacist if you have questions. COMMON BRAND NAME(S): Venofer What should I tell my care team before I take this medication? They need to know if you have any of these conditions: Anemia not caused by low iron levels Heart disease High levels of iron in the blood Kidney disease Liver disease An unusual or allergic reaction to iron, other medications, foods, dyes, or preservatives Pregnant or trying to get pregnant Breastfeeding How should I use this medication? This medication is for infusion into a vein. It is given in a hospital or clinic setting. Talk to your care team about the use of this medication in children. While this medication may be prescribed for children as young as 2 years for selected conditions, precautions do apply. Overdosage: If you think you have taken too much of this medicine contact a poison control center or emergency room at once. NOTE: This medicine is only for you. Do not share this medicine with others. What if I miss a dose? Keep appointments for follow-up doses. It is important not to miss your dose. Call your care team if you are unable to keep an appointment. What may interact with this medication? Do not take this medication with any of the following: Deferoxamine Dimercaprol Other iron products This medication may also interact with the following: Chloramphenicol Deferasirox This list may not describe all possible interactions. Give your health care provider a list of all the medicines, herbs, non-prescription drugs, or dietary supplements you use. Also tell them if you smoke,  drink alcohol, or use illegal drugs. Some items may interact with your medicine. What should I watch for while using this medication? Visit your care team regularly. Tell your care team if your symptoms do not start to get better or if they get worse. You may need blood work done while you are taking this medication. You may need to follow a special diet. Talk to your care team. Foods that contain iron include: whole grains/cereals, dried fruits, beans, or peas, leafy green vegetables, and organ meats (liver, kidney). What side effects may I notice from receiving this medication? Side effects that you should report to your care team as soon as possible: Allergic reactions--skin rash, itching, hives, swelling of the face, lips, tongue, or throat Low blood pressure--dizziness, feeling faint or lightheaded, blurry vision Shortness of breath Side effects that usually do not require medical attention (report to your care team if they continue or are bothersome): Flushing Headache Joint pain Muscle pain Nausea Pain, redness, or irritation at injection site This list may not describe all possible side effects. Call your doctor for medical advice about side effects. You may report side effects to FDA at 1-800-FDA-1088. Where should I keep my medication? This medication is given in a hospital or clinic. It will not be stored at home. NOTE: This sheet is a summary. It may not cover all possible information. If you have questions about this medicine, talk to your doctor, pharmacist, or health care provider.  2024 Elsevier/Gold Standard (2023-05-14 00:00:00)

## 2023-07-15 DIAGNOSIS — M25561 Pain in right knee: Secondary | ICD-10-CM | POA: Diagnosis not present

## 2023-07-15 DIAGNOSIS — M25562 Pain in left knee: Secondary | ICD-10-CM | POA: Diagnosis not present

## 2023-07-16 ENCOUNTER — Telehealth: Payer: Self-pay

## 2023-07-16 ENCOUNTER — Inpatient Hospital Stay: Payer: 59

## 2023-07-16 NOTE — Telephone Encounter (Signed)
Brandy Foster states that she is not feeling well today and forgot to call to cancel her appointment. She agreed to be r/s to next Friday 07-23-23 at 2:15 pm with a 2 pm arrival for her iron. Reviewed follow up appointment with lab and visit with Dr. Candise Che on 07-30-23 at 2:30 pm. Pt verbalized understanding.

## 2023-07-23 ENCOUNTER — Inpatient Hospital Stay: Payer: 59 | Attending: Hematology

## 2023-07-23 ENCOUNTER — Other Ambulatory Visit: Payer: Self-pay

## 2023-07-23 ENCOUNTER — Inpatient Hospital Stay: Payer: 59

## 2023-07-23 ENCOUNTER — Other Ambulatory Visit: Payer: 59

## 2023-07-23 ENCOUNTER — Ambulatory Visit: Payer: 59 | Admitting: Hematology

## 2023-07-23 VITALS — BP 119/72 | HR 88 | Temp 98.4°F | Resp 20

## 2023-07-23 DIAGNOSIS — D500 Iron deficiency anemia secondary to blood loss (chronic): Secondary | ICD-10-CM | POA: Insufficient documentation

## 2023-07-23 DIAGNOSIS — N920 Excessive and frequent menstruation with regular cycle: Secondary | ICD-10-CM | POA: Insufficient documentation

## 2023-07-23 MED ORDER — SODIUM CHLORIDE 0.9 % IV SOLN: Freq: Once | INTRAVENOUS | Status: AC

## 2023-07-23 MED ORDER — LORATADINE 10 MG PO TABS
10.0000 mg | ORAL_TABLET | Freq: Once | ORAL | Status: AC
  Administered 2023-07-23: 10 mg via ORAL
  Filled 2023-07-23: qty 1

## 2023-07-23 MED ORDER — ACETAMINOPHEN 325 MG PO TABS
650.0000 mg | ORAL_TABLET | Freq: Once | ORAL | Status: AC
  Administered 2023-07-23: 650 mg via ORAL
  Filled 2023-07-23: qty 2

## 2023-07-23 MED ORDER — SODIUM CHLORIDE 0.9 % IV SOLN
300.0000 mg | Freq: Once | INTRAVENOUS | Status: AC
  Administered 2023-07-23: 300 mg via INTRAVENOUS
  Filled 2023-07-23: qty 300

## 2023-07-23 NOTE — Patient Instructions (Signed)
Iron Sucrose Injection What is this medication? IRON SUCROSE (EYE ern SOO krose) treats low levels of iron (iron deficiency anemia) in people with kidney disease. Iron is a mineral that plays an important role in making red blood cells, which carry oxygen from your lungs to the rest of your body. This medicine may be used for other purposes; ask your health care provider or pharmacist if you have questions. COMMON BRAND NAME(S): Venofer What should I tell my care team before I take this medication? They need to know if you have any of these conditions: Anemia not caused by low iron levels Heart disease High levels of iron in the blood Kidney disease Liver disease An unusual or allergic reaction to iron, other medications, foods, dyes, or preservatives Pregnant or trying to get pregnant Breastfeeding How should I use this medication? This medication is for infusion into a vein. It is given in a hospital or clinic setting. Talk to your care team about the use of this medication in children. While this medication may be prescribed for children as young as 2 years for selected conditions, precautions do apply. Overdosage: If you think you have taken too much of this medicine contact a poison control center or emergency room at once. NOTE: This medicine is only for you. Do not share this medicine with others. What if I miss a dose? Keep appointments for follow-up doses. It is important not to miss your dose. Call your care team if you are unable to keep an appointment. What may interact with this medication? Do not take this medication with any of the following: Deferoxamine Dimercaprol Other iron products This medication may also interact with the following: Chloramphenicol Deferasirox This list may not describe all possible interactions. Give your health care provider a list of all the medicines, herbs, non-prescription drugs, or dietary supplements you use. Also tell them if you smoke,  drink alcohol, or use illegal drugs. Some items may interact with your medicine. What should I watch for while using this medication? Visit your care team regularly. Tell your care team if your symptoms do not start to get better or if they get worse. You may need blood work done while you are taking this medication. You may need to follow a special diet. Talk to your care team. Foods that contain iron include: whole grains/cereals, dried fruits, beans, or peas, leafy green vegetables, and organ meats (liver, kidney). What side effects may I notice from receiving this medication? Side effects that you should report to your care team as soon as possible: Allergic reactions--skin rash, itching, hives, swelling of the face, lips, tongue, or throat Low blood pressure--dizziness, feeling faint or lightheaded, blurry vision Shortness of breath Side effects that usually do not require medical attention (report to your care team if they continue or are bothersome): Flushing Headache Joint pain Muscle pain Nausea Pain, redness, or irritation at injection site This list may not describe all possible side effects. Call your doctor for medical advice about side effects. You may report side effects to FDA at 1-800-FDA-1088. Where should I keep my medication? This medication is given in a hospital or clinic. It will not be stored at home. NOTE: This sheet is a summary. It may not cover all possible information. If you have questions about this medicine, talk to your doctor, pharmacist, or health care provider.  2024 Elsevier/Gold Standard (2023-05-14 00:00:00)

## 2023-07-30 ENCOUNTER — Telehealth: Payer: Self-pay | Admitting: Hematology

## 2023-07-30 ENCOUNTER — Inpatient Hospital Stay: Payer: 59

## 2023-07-30 ENCOUNTER — Inpatient Hospital Stay: Payer: 59 | Admitting: Hematology

## 2023-08-06 ENCOUNTER — Inpatient Hospital Stay: Payer: 59 | Admitting: Hematology

## 2023-08-06 ENCOUNTER — Inpatient Hospital Stay: Payer: 59

## 2023-08-06 NOTE — Progress Notes (Shared)
HEMATOLOGY/ONCOLOGY CLINIC NOTE  Date of Service: 08/06/23  Patient Care Team: Tiffany Kocher, DO as PCP - General (Family Medicine)   CHIEF COMPLAINTS/PURPOSE OF CONSULTATION:  Iron deficiency anemia   HISTORY OF PRESENTING ILLNESS:  Brandy Foster is a wonderful 47 y.o. female who has been referred to [place] by Dr. Durward Parcel for evaluation and management of Iron deficiency anemia. The pt reports that she is doing well overall.   The pt notes that she currently feels tired, and denies any overt light headedness or dizziness.   However, the pt reports that she has had sensations that she is about to pass out for a few months, and that this was worse in the summertime. She notes that she hasn't felt as though she was going to pass out recently. She notes she has intermittently taken PO Iron replacement for several years. She is taking one pill of 325mg  Ferrous Sulfate a day, but forgets some days, and takes it with food, but notes that she has some abdominal discomfort with PO Iron replacement. The pt notes that she has ice cravings.  The pt notes that she has heavy periods, which has only been the case for the past few months. She notes that she uses two pads simultaneously for the first couple days and her periods last about 5 days. The pt notes that she last saw her OBGYN about a year ago. She notes that she had small fibroids 10 years ago, which she discovered during her second pregnancy. She has had three pregnancies, two successful pregnancies 10 and 21 years ago, and one miscarriage a "few years ago." She denies any recent surgeries, and denies any blood in the stools or other blood loss.   The pt denies any dietary restrictions. She denies taking any acid suppressant medication.   The pt notes that she has glaucoma and uses nightly eye drops. She denies any other medical problems.   Most recent lab results (09/07/18) of CBC w/diff is as follows: all values are WNL except for  RBC at 3.63, HGB at 7.7, HCT at 26.4, MCV at 73, MCH at 21.2, MCHC at 29.2, RDW at 16.1. 09/15/18 Anemia Panel revealed all values WNL except for Iron at 20, Iron Saturation at 5%, HCT at 276, Ferritin at 3.   On review of systems, pt reports feeling tired, heavy periods, and denies blood in the stools, current light headedness or dizziness, black stools, abdominal pains, leg swelling, and any other symptoms.   On PMHx the pt reports two cesarean sections 21 and 10 years ago, Glaucoma. Denies liver problems.  On Family Hx the pt reports iron deficiency anemia and denies sickle cell disease, blood disorders   INTERVAL HISTORY:  Brandy Foster is here today for follow up and treatment of her Iron Deficiency Anemia.   Patient was last seen by me on 01/26/2023 and she was doing well overall.     -Discussed lab results from today, 08/06/2023, with the patient.    MEDICAL HISTORY:  Past Medical History:  Diagnosis Date   Anemia    Anxiety    Chronic mental illness 06/19/2011   Pt states that she is on disability 2/2 mental illness but she doesn't know what her diagnosis is.  Pt is to request records from her psychiatrist.     Chronic mental illness 06/19/2011   Pt states that she is on disability 2/2 mental illness but she doesn't know what her diagnosis is.  Pt is  to request records from her psychiatrist.     Depression    Obesity     SURGICAL HISTORY: Past Surgical History:  Procedure Laterality Date   CESAREAN SECTION      SOCIAL HISTORY: Social History   Socioeconomic History   Marital status: Single    Spouse name: Not on file   Number of children: 2   Years of education: Not on file   Highest education level: Not on file  Occupational History   Occupation: Disability  Tobacco Use   Smoking status: Never   Smokeless tobacco: Never  Vaping Use   Vaping status: Never Used  Substance and Sexual Activity   Alcohol use: No   Drug use: No   Sexual activity: Yes     Birth control/protection: None  Other Topics Concern   Not on file  Social History Narrative   On disability- not sure what her disability is- for mental  Health reasons.  Unsure of diagnosis.     Mother gets disability check and gives pt the money. - mom pays bills and then gives her the rest.    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    Lives at rental home- has 2 children (ages 2 (melanie) and 76 [place]))            Social Determinants of Health   Financial Resource Strain: Low Risk  (02/16/2023)   Overall Financial Resource Strain (CARDIA)    Difficulty of Paying Living Expenses: Not hard at all  Food Insecurity: No Food Insecurity (02/16/2023)   Hunger Vital Sign    Worried About Running Out of Food in the Last Year: Never true    Ran Out of Food in the Last Year: Never true  Transportation Needs: No Transportation Needs (02/16/2023)   PRAPARE - Administrator, Civil Service (Medical): No    Lack of Transportation (Non-Medical): No  Physical Activity: Insufficiently Active (02/16/2023)   Exercise Vital Sign    Days of Exercise per Week: 3 days    Minutes of Exercise per Session: 30 min  Stress: No Stress Concern Present (02/16/2023)   [employer] of Occupational Health - Occupational Stress Questionnaire    Feeling of Stress : Not at all  Social Connections: Socially Isolated (02/16/2023)   Social Connection and Isolation Panel [NHANES]    Frequency of Communication with Friends and Family: More than three times a week    Frequency of Social Gatherings with Friends and Family: Twice a week    Attends Religious Services: Never    Database administrator or Organizations: No    Attends Banker Meetings: Never    Marital Status: Never married  Intimate Partner Violence: Not At Risk (02/16/2023)   Humiliation, Afraid, Rape, and Kick questionnaire    Fear of  Current or Ex-Partner: No    Emotionally Abused: No    Physically Abused: No    Sexually Abused: No    FAMILY HISTORY: Family History  Problem Relation Age of Onset   Diabetes Mother    COPD Mother    Arthritis Mother    Hypertension Mother    Hyperlipidemia Father    Schizophrenia Brother     ALLERGIES:  has No Known Allergies.  MEDICATIONS:  Current Outpatient Medications  Medication Sig Dispense Refill   cetirizine (ZYRTEC) 10 MG tablet Take 1 tablet (10 mg total) by mouth daily  as needed for allergies. 30 tablet 11   fluconazole (DIFLUCAN) 150 MG tablet Take 1 tablet now, then 1 tablet in 72 hours (Patient not taking: Reported on 02/16/2023) 2 tablet 0   fluticasone (FLONASE) 50 MCG/ACT nasal spray Place 2 sprays into both nostrils daily. (Patient not taking: Reported on 02/16/2023) 16 g 6   hydrOXYzine (ATARAX) 10 MG tablet TAKE 1 TABLET (10 MG TOTAL) BY MOUTH AT BEDTIME AS NEEDED FOR ANXIETY 90 tablet 1   iron polysaccharides (NIFEREX) 150 MG capsule Take 1 capsule (150 mg total) by mouth daily. (Patient not taking: Reported on 02/16/2023) 60 capsule 3   LATANOPROST OP Place 1 drop into both eyes at bedtime.     naproxen (NAPROSYN) 500 MG tablet Take 1 tablet (500 mg total) by mouth 2 (two) times daily. 20 tablet 0   No current facility-administered medications for this visit.    REVIEW OF SYSTEMS:   A 10+ POINT REVIEW OF SYSTEMS WAS OBTAINED including neurology, dermatology, psychiatry, cardiac, respiratory, lymph, extremities, GI, GU, Musculoskeletal, constitutional, breasts, reproductive, HEENT.  All pertinent positives are noted in the HPI.  All others are negative.     PHYSICAL EXAMINATION:  . There were no vitals filed for this visit.  There were no vitals filed for this visit. There is no height or weight on file to calculate BMI. NAD GENERAL:alert, in no acute distress and comfortable SKIN: no acute rashes, no significant lesions EYES: conjunctiva are pink  and non-injected, sclera anicteric NECK: supple, no JVD LYMPH:  no palpable lymphadenopathy in the cervical, axillary or inguinal regions LUNGS: clear to auscultation b/l with normal respiratory effort HEART: regular rate & rhythm ABDOMEN:  normoactive bowel sounds , non tender, not distended. Extremity: no pedal edema PSYCH: alert & oriented x 3 with fluent speech NEURO: no focal motor/sensory deficits   LABORATORY DATA:  I have reviewed the data as listed  .    Latest Ref Rng & Units 02/15/2023   12:02 PM 09/02/2022   12:37 PM 08/20/2022    2:54 PM  CBC  WBC 4.0 - 10.5 K/uL 8.5  8.9  7.8   Hemoglobin 12.0 - 15.0 g/dL 9.6  9.0  8.0   Hematocrit 36.0 - 46.0 % 32.6  32.9  29.8   Platelets 150 - 400 K/uL 321  350  385     .    Latest Ref Rng & Units 02/15/2023   12:02 PM 09/02/2022   12:37 PM 08/20/2022    2:54 PM  CMP  Glucose 70 - 99 mg/dL 97  93  95   BUN 6 - 20 mg/dL 11  10  8    Creatinine 0.44 - 1.00 mg/dL 1.30  8.65  7.84   Sodium 135 - 145 mmol/L 137  137  141   Potassium 3.5 - 5.1 mmol/L 4.2  4.0  4.0   Chloride 98 - 111 mmol/L 106  106  106   CO2 22 - 32 mmol/L 25  28  20    Calcium 8.9 - 10.3 mg/dL 8.5  9.7  9.1   Total Protein 6.5 - 8.1 g/dL 6.8  7.0    Total Bilirubin 0.3 - 1.2 mg/dL 0.3  0.3    Alkaline Phos 38 - 126 U/L 51  59    AST 15 - 41 U/L 11  18    ALT 0 - 44 U/L 7  9     . Lab Results  Component Value Date   IRON 35  02/15/2023   TIBC 462 (H) 02/15/2023   IRONPCTSAT 8 (L) 02/15/2023   (Iron and TIBC)  Lab Results  Component Value Date   FERRITIN 5 (L) 02/15/2023      RADIOGRAPHIC STUDIES: I have personally reviewed the radiological images as listed and agreed with the findings in the report. No results found.  ASSESSMENT & PLAN:   47 y.o. female with  1. Severe Iron deficiency anemia - related to menorrhagia  PLAN: -Discussed lab results from today, 02/15/2023, with the patient. CBC shows that the patient is anemic with hemoglobin  of 9.6 and hematocrit of 32.6. CMP is stable, except slightly decreased calcium levels of 8.5 and decreased AST level of 11. Iron saturation rate is 8%. Patient is iron deficient.  -Recommended to follow-up with pcp and get a referral for OBGYN regarding heavy menstrual cycle.  -Recommended IV Iron due to iron deficient with anemia. Patient agrees to receive IV Iron.  -Patient can start taking iron supplement.   FOLLOW-UP: ***  The total time spent in the appointment was *** minutes* .  All of the patient's questions were answered with apparent satisfaction. The patient knows to call the clinic with any problems, questions or concerns.   Wyvonnia Lora MD MS AAHIVMS [facility] Hematology/Oncology Physician [facility]  .*Total Encounter Time as defined by the Centers for Medicare and Medicaid Services includes, in addition to the face-to-face time of a patient visit (documented in the note above) non-face-to-face time: obtaining and reviewing outside history, ordering and reviewing medications, tests or procedures, care coordination (communications with other health care professionals or caregivers) and documentation in the medical record.   I,Param Shah,acting as a Neurosurgeon for Wyvonnia Lora, MD.,have documented all relevant documentation on the behalf of Wyvonnia Lora, MD,as directed by  Wyvonnia Lora, MD while in the presence of Wyvonnia Lora, MD.

## 2023-10-19 DIAGNOSIS — H40153 Residual stage of open-angle glaucoma, bilateral: Secondary | ICD-10-CM | POA: Diagnosis not present

## 2024-02-01 DIAGNOSIS — H40153 Residual stage of open-angle glaucoma, bilateral: Secondary | ICD-10-CM | POA: Diagnosis not present

## 2024-03-26 ENCOUNTER — Emergency Department (HOSPITAL_COMMUNITY)
Admission: EM | Admit: 2024-03-26 | Discharge: 2024-03-27 | Disposition: A | Discharge status: Home / Self Care | Attending: Emergency Medicine | Admitting: Emergency Medicine

## 2024-03-26 ENCOUNTER — Other Ambulatory Visit: Payer: Self-pay

## 2024-03-26 ENCOUNTER — Emergency Department (HOSPITAL_COMMUNITY)

## 2024-03-26 ENCOUNTER — Encounter (HOSPITAL_COMMUNITY): Payer: Self-pay | Admitting: *Deleted

## 2024-03-26 DIAGNOSIS — R079 Chest pain, unspecified: Secondary | ICD-10-CM | POA: Diagnosis not present

## 2024-03-26 DIAGNOSIS — R0789 Other chest pain: Secondary | ICD-10-CM | POA: Insufficient documentation

## 2024-03-26 DIAGNOSIS — R059 Cough, unspecified: Secondary | ICD-10-CM | POA: Diagnosis not present

## 2024-03-26 LAB — BASIC METABOLIC PANEL WITH GFR
Anion gap: 6 (ref 5–15)
BUN: 8 mg/dL (ref 6–20)
CO2: 23 mmol/L (ref 22–32)
Calcium: 8.9 mg/dL (ref 8.9–10.3)
Chloride: 111 mmol/L (ref 98–111)
Creatinine, Ser: 0.72 mg/dL (ref 0.44–1.00)
GFR, Estimated: >60 mL/min (ref >60)
Glucose, Bld: 95 mg/dL (ref 70–99)
Potassium: 3.8 mmol/L (ref 3.5–5.1)
Sodium: 140 mmol/L (ref 135–145)

## 2024-03-26 LAB — CBC
HCT: 31.1 % — ABNORMAL LOW (ref 36.0–46.0)
Hemoglobin: 8.4 g/dL — ABNORMAL LOW (ref 12.0–15.0)
MCH: 20.9 pg — ABNORMAL LOW (ref 26.0–34.0)
MCHC: 27 g/dL — ABNORMAL LOW (ref 30.0–36.0)
MCV: 77.6 fL — ABNORMAL LOW (ref 80.0–100.0)
Platelets: 401 10*3/uL — ABNORMAL HIGH (ref 150–400)
RBC: 4.01 MIL/uL (ref 3.87–5.11)
RDW: 17.6 % — ABNORMAL HIGH (ref 11.5–15.5)
WBC: 10.1 10*3/uL (ref 4.0–10.5)
nRBC: 0 % (ref 0.0–0.2)

## 2024-03-26 LAB — TROPONIN I (HIGH SENSITIVITY): Troponin I (High Sensitivity): 3 ng/L (ref <18)

## 2024-03-26 NOTE — ED Triage Notes (Signed)
 Chest pain for 2 days and she a cough and congestion for 2 days   lmp scattered

## 2024-03-26 NOTE — ED Notes (Signed)
 EKG shown to [facility] and is in his possession.  KM

## 2024-03-27 DIAGNOSIS — R0789 Other chest pain: Secondary | ICD-10-CM | POA: Diagnosis not present

## 2024-03-27 LAB — TROPONIN I (HIGH SENSITIVITY): Troponin I (High Sensitivity): 2 ng/L (ref <18)

## 2024-03-27 LAB — D-DIMER, QUANTITATIVE: D-Dimer, Quant: <0.27 ug{FEU}/mL (ref 0.00–0.50)

## 2024-03-27 MED ORDER — OXYCODONE-ACETAMINOPHEN 5-325 MG PO TABS
1.0000 | ORAL_TABLET | Freq: Once | ORAL | Status: AC
  Administered 2024-03-27: 1 via ORAL
  Filled 2024-03-27: qty 1

## 2024-03-27 NOTE — Discharge Instructions (Addendum)
 You were seen today for chest pain.  Your workup is reassuring including screening for blood clots.  Follow-up with your primary doctor.  Take ibuprofen to see if this improves your symptoms.  You were referred to cardiology given your age and risk factors.

## 2024-03-27 NOTE — ED Provider Notes (Signed)
 [place] [facility]ERGENC[facility] DEPARTMENT AT [facility] Provider Note   CSN: [id number] Arrival date & time: 03/26/24  2221     History  Chief Complaint  Patient presents with   Chest Pain    Brandy Foster is a 49 y.o. female.  HPI     [name] who presents with chest discomfort.  Patient reports 2 to 3-day history of intermittent chest pain.  She describes it as sharp.  It is worse with breathing and moving.  Denies any heavy lifting or injury.  No shortness of breath.  No history of blood clots but states that her father had blood clots.  Has not taken anything for her pain.  Home Medications Prior to Admission medications   Medication Sig Start Date End Date Taking? Authorizing Provider  cetirizine (ZYRTEC) 10 MG tablet Take 1 tablet (10 mg total) by mouth daily as needed for allergies. 10/27/19   Meccariello, Solmon Ice, MD  fluconazole (DIFLUCAN) 150 MG tablet Take 1 tablet now, then 1 tablet in 72 hours Patient not taking: Reported on 02/16/2023 12/24/20   Orpah Cobb P, DO  fluticasone (FLONASE) 50 MCG/ACT nasal spray Place 2 sprays into both nostrils daily. Patient not taking: Reported on 02/16/2023 10/27/19   Meccariello, Solmon Ice, MD  hydrOXYzine (ATARAX) 10 MG tablet TAKE 1 TABLET (10 MG TOTAL) BY MOUTH AT BEDTIME AS NEEDED FOR ANXIETY 08/28/22   Tiffany Kocher, DO  iron polysaccharides (NIFEREX) 150 MG capsule Take 1 capsule (150 mg total) by mouth daily. Patient not taking: Reported on 02/16/2023 09/02/22   Johney Maine, MD  LATANOPROST OP Place 1 drop into both eyes at bedtime.    [provider]  naproxen (NAPROSYN) 500 MG tablet Take 1 tablet (500 mg total) by mouth 2 (two) times daily. 08/24/16   Janne Napoleon, NP      Allergies    Patient has no known allergies.    Review of Systems   Review of Systems  Constitutional:  Negative for fever.  Respiratory:  Negative for cough and shortness of breath.   Cardiovascular:  Positive  for chest pain. Negative for leg swelling.  All other systems reviewed and are negative.   Physical Exam Updated Vital Signs BP (!) 108/58 (BP Location: Right Arm)   Pulse 88   Temp 98.6 F (37 C) (Oral)   Resp 18   Ht 1.727 m (5\' 8" )   Wt 130.4 kg   LMP 03/21/2024   SpO2 100%   BMI 43.71 kg/m  Physical Exam Vitals and nursing note reviewed.  Constitutional:      Appearance: She is well-developed. She is obese.  HENT:     Head: Normocephalic and atraumatic.  Eyes:     Pupils: Pupils are equal, round, and reactive to light.  Cardiovascular:     Rate and Rhythm: Normal rate and regular rhythm.     Heart sounds: Normal heart sounds.  Pulmonary:     Effort: Pulmonary effort is normal. No respiratory distress.     Breath sounds: No wheezing.  Chest:     Chest wall: No tenderness.  Abdominal:     General: Bowel sounds are normal.     Palpations: Abdomen is soft.  Musculoskeletal:     Cervical back: Neck supple.  Skin:    General: Skin is warm and dry.  Neurological:     Mental Status: She is alert and oriented to person, place, and time.  Psychiatric:  Mood and Affect: Mood normal.     ED Results / Procedures / Treatments   Labs (all labs ordered are listed, but only abnormal results are displayed) Labs Reviewed  CBC - Abnormal; Notable for the following components:      Result Value   Hemoglobin 8.4 (*)    HCT 31.1 (*)    MCV 77.6 (*)    MCH 20.9 (*)    MCHC 27.0 (*)    RDW 17.6 (*)    Platelets 401 (*)    All other components within normal limits  BASIC METABOLIC PANEL WITH GFR  D-DIMER, QUANTITATIVE (NOT AT [facility])  HCG, SERUM, QUALITATIVE  TROPONIN I (HIGH SENSITIVITY)  TROPONIN I (HIGH SENSITIVITY)    EKG EKG Interpretation Date/Time:  Sunday March 26 2024 22:58:37 EDT Ventricular Rate:  94 PR Interval:  152 QRS Duration:  88 QT Interval:  364 QTC Calculation: 455 R Axis:   36  Text Interpretation: Normal sinus rhythm Normal ECG When  compared with ECG of 17-Apr-2015 15:27, PREVIOUS ECG IS PRESENT No significant change since last tracing Confirmed by Zadie Rhine ([id number]) on 03/26/2024 11:08:05 PM  Radiology DG Chest 2 View Result Date: 03/26/2024 CLINICAL DATA:  Chest pain.  Cough for 2 days. EXAM: CHEST - 2 VIEW COMPARISON:  Remote radiograph 03/04/2010 FINDINGS: The cardiomediastinal contours are normal. The lungs are clear. Pulmonary vasculature is normal. No consolidation, pleural effusion, or pneumothorax. No acute osseous abnormalities are seen. IMPRESSION: No active cardiopulmonary disease. Electronically Signed   By: Narda Rutherford M.D.   On: 03/26/2024 23:44    Procedures Procedures    Medications Ordered in ED Medications  oxyCODONE-acetaminophen (PERCOCET/ROXICET) 5-325 MG per tablet 1 tablet (1 tablet Oral Given 03/27/24 0326)    ED Course/ Medical Decision Making/ A&P                                 Medical Decision Making Amount and/or Complexity of Data Reviewed Labs: ordered.  Risk Prescription drug management.   This patient presents to the ED for concern of chest pain, this involves an extensive number of treatment options, and is a complaint that carries with it a high risk of complications and morbidity.  I considered the following differential and admission for this acute, potentially life threatening condition.  The differential diagnosis includes ACS, PE, pneumothorax, pneumonia, chest wall pain, reflux  MDM:    This is a 48 year old female who presents with chest pain.  She is nontoxic and vital signs are largely reassuring.  Pain is mostly with movement and breathing.  No infectious symptoms.  No lower extremity edema.  Not exertional in nature.  Very atypical for ACS.  EKG without any acute ischemia or arrhythmia.  Troponin x 2 negative.  Screening D-dimer is also negative.  This makes PE less likely.  Chest x-ray without pneumonia or pneumothorax.  Overall workup is reassuring.  Will  trial anti-inflammatories.  Given her age and obesity, will refer to cardiology for definitive ischemic evaluation.  (Labs, imaging, consults)  Labs: I Ordered, and personally interpreted labs.  The pertinent results include: CBC, BMP, troponin x 2, D-dimer  Imaging Studies ordered: I ordered imaging studies including x-ray I independently visualized and interpreted imaging. I agree with the radiologist interpretation  Additional history obtained from chart review.  External records from outside source obtained and reviewed including prior evaluations  Cardiac Monitoring: The patient was maintained on a  cardiac monitor.  If on the cardiac monitor, I personally viewed and interpreted the cardiac monitored which showed an underlying rhythm of: Sinus  Reevaluation: After the interventions noted above, I reevaluated the patient and found that they have :stayed the same  Social Determinants of Health:  lives independently  Disposition: Discharge  Co morbidities that complicate the patient evaluation  Past Medical History:  Diagnosis Date   Anemia    Anxiety    Chronic mental illness 06/19/2011   Pt states that she is on disability 2/2 mental illness but she doesn't know what her diagnosis is.  Pt is to request records from her psychiatrist.     Chronic mental illness 06/19/2011   Pt states that she is on disability 2/2 mental illness but she doesn't know what her diagnosis is.  Pt is to request records from her psychiatrist.     Depression    Obesity      Medicines Meds ordered this encounter  Medications   oxyCODONE-acetaminophen (PERCOCET/ROXICET) 5-325 MG per tablet 1 tablet    Refill:  0    I have reviewed the patients home medicines and have made adjustments as needed  Problem List / ED Course: Problem List Items Addressed This Visit   None Visit Diagnoses       Atypical chest pain    -  Primary   Relevant Orders   Ambulatory referral to Cardiology                    Final Clinical Impression(s) / ED Diagnoses Final diagnoses:  Atypical chest pain    Rx / DC Orders ED Discharge Orders          Ordered    Ambulatory referral to Cardiology        03/27/24 0549              Shon Baton, MD 03/27/24 [phone]

## 2024-07-11 DIAGNOSIS — Z5181 Encounter for therapeutic drug level monitoring: Secondary | ICD-10-CM | POA: Diagnosis not present

## 2024-08-11 DIAGNOSIS — J029 Acute pharyngitis, unspecified: Secondary | ICD-10-CM | POA: Diagnosis not present

## 2024-08-11 DIAGNOSIS — H9203 Otalgia, bilateral: Secondary | ICD-10-CM | POA: Diagnosis not present

## 2024-08-30 ENCOUNTER — Encounter: Payer: Self-pay | Admitting: Hematology

## 2024-08-30 ENCOUNTER — Other Ambulatory Visit: Payer: Self-pay

## 2024-08-30 ENCOUNTER — Ambulatory Visit
Admission: RE | Admit: 2024-08-30 | Discharge: 2024-08-30 | Disposition: A | Discharge status: Home / Self Care | Source: Ambulatory Visit | Attending: Family Medicine | Admitting: Family Medicine

## 2024-08-30 VITALS — BP 150/84 | HR 109 | Temp 99.7°F | Resp 17

## 2024-08-30 DIAGNOSIS — H6991 Unspecified Eustachian tube disorder, right ear: Secondary | ICD-10-CM

## 2024-08-30 DIAGNOSIS — R051 Acute cough: Secondary | ICD-10-CM

## 2024-08-30 DIAGNOSIS — U071 COVID-19: Secondary | ICD-10-CM | POA: Diagnosis not present

## 2024-08-30 LAB — POC SOFIA SARS ANTIGEN FIA: SARS Coronavirus 2 Ag: POSITIVE — AB

## 2024-08-30 MED ORDER — BENZONATATE 200 MG PO CAPS: 200.0000 mg | ORAL_CAPSULE | Freq: Three times a day (TID) | ORAL | 0 refills | Status: AC | PRN

## 2024-08-30 MED ORDER — FLUTICASONE PROPIONATE 50 MCG/ACT NA SUSP: 1.0000 | Freq: Every day | NASAL | 0 refills | Status: AC

## 2024-08-30 MED ORDER — MOLNUPIRAVIR 200 MG PO CAPS
4.0000 | ORAL_CAPSULE | Freq: Two times a day (BID) | ORAL | 0 refills | Status: AC
Start: 2024-08-30 — End: 2024-09-04

## 2024-08-30 NOTE — ED Provider Notes (Signed)
 [facility]    CSN: [id number] Arrival date & time: 08/30/24  1813      History   Chief Complaint Chief Complaint  Patient presents with   Nasal Congestion    Headache, ear ache, sore throat , chills,  body ache, - Entered by patient    HPI Brandy Foster is a 48 y.o. female  presents for evaluation of URI symptoms for 3 days. Patient reports associated symptoms of cough, congestion, ear pain, sore throat, body aches, headache, chills. Denies N/V/D, shortness of breath. Patient does not have a hx of asthma. Patient is not an active smoker.   Reports no known sick contacts.  Pt has taken medicine and NyQuil OTC for symptoms. Pt has no other concerns at this time.   HPI  Past Medical History:  Diagnosis Date   Anemia    Anxiety    Chronic mental illness 06/19/2011   Pt states that she is on disability 2/2 mental illness but she doesn't know what her diagnosis is.  Pt is to request records from her psychiatrist.     Chronic mental illness 06/19/2011   Pt states that she is on disability 2/2 mental illness but she doesn't know what her diagnosis is.  Pt is to request records from her psychiatrist.     Depression    Obesity     Patient Active Problem List   Diagnosis Date Noted   Elevated fasting glucose 09/09/2022   Difficulty sleeping 07/27/2022   Sebaceous cyst 04/15/2022   Vaginal discharge 12/11/2020   Postconcussion syndrome 10/27/2019   Seasonal allergic rhinitis 10/27/2019   Iron  deficiency anemia due to chronic blood loss 11/29/2018   Dizziness 09/07/2018   Severe obesity (BMI >= 40) (HCC) 06/12/2014    Past Surgical History:  Procedure Laterality Date   CESAREAN SECTION      OB History     Gravida  6   Para  2   Term  2   Preterm      AB  3   Living  2      SAB  1   IAB  2   Ectopic      Multiple      Live Births               Home Medications    Prior to Admission medications   Medication Sig Start Date End Date  Taking? Authorizing Provider  benzonatate  (TESSALON ) 200 MG capsule Take 1 capsule (200 mg total) by mouth 3 (three) times daily as needed. 08/30/24  Yes Ahmaad Neidhardt, Jodi R, NP  fluticasone  (FLONASE ) 50 MCG/ACT nasal spray Place 1 spray into both nostrils daily. 08/30/24  Yes Tela Kotecki, Jodi R, NP  molnupiravir  EUA (LAGEVRIO ) 200 MG CAPS capsule Take 4 capsules (800 mg total) by mouth 2 (two) times daily for 5 days. 08/30/24 09/04/24 Yes Kendallyn Lippold, Jodi R, NP  cetirizine  (ZYRTEC ) 10 MG tablet Take 1 tablet (10 mg total) by mouth daily as needed for allergies. 10/27/19   Meccariello, Con PARAS, MD  hydrOXYzine  (ATARAX ) 10 MG tablet TAKE 1 TABLET (10 MG TOTAL) BY MOUTH AT BEDTIME AS NEEDED FOR ANXIETY 08/28/22   Howell Lunger, DO  LATANOPROST OP Place 1 drop into both eyes at bedtime.    [provider]  naproxen  (NAPROSYN ) 500 MG tablet Take 1 tablet (500 mg total) by mouth 2 (two) times daily. 08/24/16   Jamelle Lorrayne HERO, NP    Family History Family History  Problem  Relation Age of Onset   Diabetes Mother    COPD Mother    Arthritis Mother    Hypertension Mother    Hyperlipidemia Father    Schizophrenia Brother     Social History Social History   Tobacco Use   Smoking status: Never   Smokeless tobacco: Never  Vaping Use   Vaping status: Never Used  Substance Use Topics   Alcohol use: No   Drug use: No     Allergies   Patient has no known allergies.   Review of Systems Review of Systems  HENT:  Positive for congestion, ear pain and sore throat.   Respiratory:  Positive for cough.   Musculoskeletal:  Positive for myalgias.  Neurological:  Positive for headaches.     Physical Exam Triage Vital Signs ED Triage Vitals  Encounter Vitals Group     BP 08/30/24 1827 (!) 150/84     Girls Systolic BP Percentile --      Girls Diastolic BP Percentile --      Boys Systolic BP Percentile --      Boys Diastolic BP Percentile --      Pulse Rate 08/30/24 1827 (!) 109     Resp 08/30/24 1827  17     Temp 08/30/24 1827 99.7 F (37.6 C)     Temp Source 08/30/24 1827 Oral     SpO2 08/30/24 1827 98 %     Weight --      Height --      Head Circumference --      Peak Flow --      Pain Score 08/30/24 1825 8     Pain Loc --      Pain Education --      Exclude from Growth Chart --    No data found.  Updated Vital Signs BP (!) 150/84   Pulse (!) 109   Temp 99.7 F (37.6 C) (Oral)   Resp 17   LMP 08/30/2024   SpO2 98%   Visual Acuity Right Eye Distance:   Left Eye Distance:   Bilateral Distance:    Right Eye Near:   Left Eye Near:    Bilateral Near:     Physical Exam Vitals and nursing note reviewed.  Constitutional:      General: She is not in acute distress.    Appearance: She is well-developed. She is not ill-appearing.  HENT:     Head: Normocephalic and atraumatic.     Right Ear: Tympanic membrane and ear canal normal.     Left Ear: Tympanic membrane and ear canal normal.     Nose: Congestion present.     Mouth/Throat:     Mouth: Mucous membranes are moist.     Pharynx: Oropharynx is clear. Uvula midline. Posterior oropharyngeal erythema present.     Tonsils: No tonsillar exudate or tonsillar abscesses.  Eyes:     Conjunctiva/sclera: Conjunctivae normal.     Pupils: Pupils are equal, round, and reactive to light.  Cardiovascular:     Rate and Rhythm: Regular rhythm. Tachycardia present.     Heart sounds: Normal heart sounds.     Comments: Mildly tachy at 109 in setting of low-grade fever Pulmonary:     Effort: Pulmonary effort is normal.     Breath sounds: Normal breath sounds. No wheezing or rhonchi.  Musculoskeletal:     Cervical back: Normal range of motion and neck supple.  Lymphadenopathy:     Cervical: No cervical adenopathy.  Skin:  General: Skin is warm and dry.  Neurological:     General: No focal deficit present.     Mental Status: She is alert and oriented to person, place, and time.  Psychiatric:        Mood and Affect: Mood  normal.        Behavior: Behavior normal.      UC Treatments / Results  Labs (all labs ordered are listed, but only abnormal results are displayed) Labs Reviewed  POC SOFIA SARS ANTIGEN FIA - Abnormal; Notable for the following components:      Result Value   SARS Coronavirus 2 Ag Positive (*)    All other components within normal limits    Basic metabolic panel Order: [id number]  Status: Final result     Next appt: 08/31/2024 at 09:00 AM in Urgent Care ([facility] PROVID[facility])   Test Result Released: Yes (not seen)   0 Result Notes          Component Ref Range & Units (hover) 5 mo ago (03/26/24) 1 yr ago (02/15/23) 1 yr ago (09/02/22) 2 yr ago (08/20/22) 5 yr ago (08/03/19) 5 yr ago (11/29/18) 8 yr ago (09/01/16)  Sodium 140 137 137 141 R 137 139 140 R  Potassium 3.8 4.2 4.0 4.0 R 4.1 3.9 4.3 R  Chloride 111 106 106 106 R 105 108 108 R  CO2 23 25 28 20  R 23 25 23  R  Glucose, Bld 95 97 CM 93 CM 95 86 100 High  108 High  R  Comment: Glucose reference range applies only to samples taken after fasting for at least 8 hours.  BUN 8 11 10 8  R 10 8 8  R  Creatinine, Ser 0.72 0.64 0.75 0.71 R 0.79 0.88 0.83 R  Calcium 8.9 8.5 Low  9.7 9.1 R 9.1 9.0 9.1 R  GFR, Estimated >60        Comment: (NOTE) Calculated using the CKD-EPI Creatinine Equation (2021)  Anion gap 6 6 CM 3 Low  CM  9 CM 6 CM   Comment: Performed at [facility], [address], [place], [place] [address]  Resulting Agency [facility]        Specimen Collected: 03/26/24 22:55 Last Resulted: 03/26/24 23:51    EKG   Radiology No results found.  Procedures Procedures (including critical care time)  Medications Ordered in UC Medications - No data to display  Initial Impression / Assessment and Plan / UC Course  I have reviewed the triage vital signs and the nursing notes.  Pertinent labs & imaging results that were available during my care  of the patient were reviewed by me and considered in my medical decision making (see chart for details).     Reviewed exam and symptoms with patient.  No red flags.  Positive COVID-19.  Given patient medical history/comorbidities she does qualify for antiviral treatment.  She is in agreement to start molnupiravir , side effect profile reviewed.  Will do Tessalon  3 times a day as needed for cough.  Flonase  daily to help with her ear pain.  Continue allergy medicine daily.  Advise rest fluids and PCP follow-up if symptoms do not improve.  Strict ER precautions reviewed and patient verbalized understanding. Final Clinical Impressions(s) / UC Diagnoses   Final diagnoses:  Acute cough  COVID-19  Dysfunction of right eustachian tube     Discharge Instructions  You have tested positive for COVID-19.  Start molnupiravir  antiviral medication twice daily for 5 days.  This medication will help prevent complication and hospitalization from the virus but does not make COVID go away.  You may take Tessalon  3 times a day as needed for your cough.  Start Flonase  daily to help with your ear pain.  Continue your allergy medicine daily.  Lots of rest and fluids.  Please follow-up with your PCP if your symptoms do not improve.  Please go to the ER if you develop any worsening symptoms.  I hope you feel better soon!     ED Prescriptions     Medication Sig Dispense Auth. Provider   benzonatate  (TESSALON ) 200 MG capsule Take 1 capsule (200 mg total) by mouth 3 (three) times daily as needed. 20 capsule Brookelynn Hamor, Jodi R, NP   fluticasone  (FLONASE ) 50 MCG/ACT nasal spray Place 1 spray into both nostrils daily. 15.8 mL Sanita Estrada, Jodi R, NP   molnupiravir  EUA (LAGEVRIO ) 200 MG CAPS capsule Take 4 capsules (800 mg total) by mouth 2 (two) times daily for 5 days. 40 capsule Steward Sames, Jodi R, NP      PDMP not reviewed this encounter.   Loreda Myla SAUNDERS, NP 08/30/24 [phone]

## 2024-08-30 NOTE — ED Triage Notes (Signed)
 Pt c/o nasal congestion, HA, bodyaches, chills bilat ear painx3d

## 2024-08-30 NOTE — Discharge Instructions (Addendum)
 You have tested positive for COVID-19.  Start molnupiravir  antiviral medication twice daily for 5 days.  This medication will help prevent complication and hospitalization from the virus but does not make COVID go away.  You may take Tessalon  3 times a day as needed for your cough.  Start Flonase  daily to help with your ear pain.  Continue your allergy medicine daily.  Lots of rest and fluids.  Please follow-up with your PCP if your symptoms do not improve.  Please go to the ER if you develop any worsening symptoms.  I hope you feel better soon!

## 2024-08-31 ENCOUNTER — Ambulatory Visit: Payer: Self-pay

## 2024-09-02 ENCOUNTER — Ambulatory Visit: Payer: Self-pay
